# Patient Record
Sex: Female | Born: 1935 | Race: White | Hispanic: No | Marital: Married | Smoking: Never smoker
Health system: Southern US, Community
[De-identification: ages and names within clinical notes are randomized; demographics above are authoritative.]

## PROBLEM LIST (undated history)

## (undated) DIAGNOSIS — H3530 Unspecified macular degeneration: Secondary | ICD-10-CM

## (undated) DIAGNOSIS — Z298 Encounter for other specified prophylactic measures: Secondary | ICD-10-CM

## (undated) DIAGNOSIS — M51369 Other intervertebral disc degeneration, lumbar region without mention of lumbar back pain or lower extremity pain: Secondary | ICD-10-CM

## (undated) DIAGNOSIS — Z8781 Personal history of (healed) traumatic fracture: Secondary | ICD-10-CM

## (undated) DIAGNOSIS — D649 Anemia, unspecified: Secondary | ICD-10-CM

## (undated) DIAGNOSIS — Z8679 Personal history of other diseases of the circulatory system: Secondary | ICD-10-CM

## (undated) DIAGNOSIS — K5790 Diverticulosis of intestine, part unspecified, without perforation or abscess without bleeding: Secondary | ICD-10-CM

## (undated) DIAGNOSIS — F0390 Unspecified dementia without behavioral disturbance: Secondary | ICD-10-CM

## (undated) DIAGNOSIS — E785 Hyperlipidemia, unspecified: Secondary | ICD-10-CM

## (undated) DIAGNOSIS — R2681 Unsteadiness on feet: Secondary | ICD-10-CM

## (undated) DIAGNOSIS — R609 Edema, unspecified: Secondary | ICD-10-CM

## (undated) DIAGNOSIS — I2510 Atherosclerotic heart disease of native coronary artery without angina pectoris: Secondary | ICD-10-CM

## (undated) DIAGNOSIS — I350 Nonrheumatic aortic (valve) stenosis: Secondary | ICD-10-CM

## (undated) DIAGNOSIS — M1990 Unspecified osteoarthritis, unspecified site: Secondary | ICD-10-CM

## (undated) DIAGNOSIS — Z2989 Encounter for other specified prophylactic measures: Secondary | ICD-10-CM

## (undated) DIAGNOSIS — I10 Essential (primary) hypertension: Secondary | ICD-10-CM

## (undated) DIAGNOSIS — M5136 Other intervertebral disc degeneration, lumbar region: Secondary | ICD-10-CM

## (undated) DIAGNOSIS — S329XXA Fracture of unspecified parts of lumbosacral spine and pelvis, initial encounter for closed fracture: Secondary | ICD-10-CM

## (undated) DIAGNOSIS — S22000A Wedge compression fracture of unspecified thoracic vertebra, initial encounter for closed fracture: Secondary | ICD-10-CM

## (undated) DIAGNOSIS — J449 Chronic obstructive pulmonary disease, unspecified: Secondary | ICD-10-CM

## (undated) DIAGNOSIS — N393 Stress incontinence (female) (male): Secondary | ICD-10-CM

## (undated) DIAGNOSIS — N3281 Overactive bladder: Secondary | ICD-10-CM

## (undated) DIAGNOSIS — N189 Chronic kidney disease, unspecified: Secondary | ICD-10-CM

## (undated) DIAGNOSIS — M810 Age-related osteoporosis without current pathological fracture: Secondary | ICD-10-CM

## (undated) DIAGNOSIS — J984 Other disorders of lung: Secondary | ICD-10-CM

## (undated) HISTORY — DX: Edema, unspecified: R60.9

## (undated) HISTORY — PX: TONSILLECTOMY: SUR1361

## (undated) HISTORY — PX: OTHER SURGICAL HISTORY: SHX169

## (undated) HISTORY — DX: Personal history of other diseases of the circulatory system: Z86.79

## (undated) HISTORY — DX: Encounter for other specified prophylactic measures: Z29.8

## (undated) HISTORY — DX: Diverticulosis of intestine, part unspecified, without perforation or abscess without bleeding: K57.90

## (undated) HISTORY — DX: Stress incontinence (female) (male): N39.3

## (undated) HISTORY — DX: Hyperlipidemia, unspecified: E78.5

## (undated) HISTORY — PX: EYE SURGERY: SHX253

## (undated) HISTORY — DX: Unspecified osteoarthritis, unspecified site: M19.90

## (undated) HISTORY — DX: Chronic kidney disease, unspecified: N18.9

## (undated) HISTORY — PX: BACK SURGERY: SHX140

## (undated) HISTORY — DX: Other intervertebral disc degeneration, lumbar region: M51.36

## (undated) HISTORY — DX: Wedge compression fracture of unspecified thoracic vertebra, initial encounter for closed fracture: S22.000A

## (undated) HISTORY — DX: Age-related osteoporosis without current pathological fracture: M81.0

## (undated) HISTORY — DX: Encounter for other specified prophylactic measures: Z29.89

## (undated) HISTORY — DX: Unsteadiness on feet: R26.81

## (undated) HISTORY — DX: Unspecified dementia, unspecified severity, without behavioral disturbance, psychotic disturbance, mood disturbance, and anxiety: F03.90

## (undated) HISTORY — DX: Nonrheumatic aortic (valve) stenosis: I35.0

## (undated) HISTORY — DX: Other intervertebral disc degeneration, lumbar region without mention of lumbar back pain or lower extremity pain: M51.369

## (undated) HISTORY — DX: Fracture of unspecified parts of lumbosacral spine and pelvis, initial encounter for closed fracture: S32.9XXA

## (undated) HISTORY — DX: Chronic obstructive pulmonary disease, unspecified: J44.9

## (undated) HISTORY — DX: Other disorders of lung: J98.4

## (undated) HISTORY — DX: Anemia, unspecified: D64.9

## (undated) HISTORY — DX: Unspecified macular degeneration: H35.30

## (undated) HISTORY — DX: Atherosclerotic heart disease of native coronary artery without angina pectoris: I25.10

## (undated) HISTORY — DX: Overactive bladder: N32.81

## (undated) HISTORY — DX: Personal history of (healed) traumatic fracture: Z87.81

---

## 2003-05-01 ENCOUNTER — Encounter: Admission: RE | Admit: 2003-05-01 | Discharge: 2003-05-01 | Payer: Self-pay | Admitting: Orthopedic Surgery

## 2003-05-01 ENCOUNTER — Encounter: Payer: Self-pay | Admitting: Orthopedic Surgery

## 2003-11-21 ENCOUNTER — Encounter: Admission: RE | Admit: 2003-11-21 | Discharge: 2003-11-21 | Payer: Self-pay | Admitting: Neurosurgery

## 2003-12-05 ENCOUNTER — Encounter: Admission: RE | Admit: 2003-12-05 | Discharge: 2003-12-05 | Payer: Self-pay | Admitting: Neurosurgery

## 2003-12-19 ENCOUNTER — Encounter: Admission: RE | Admit: 2003-12-19 | Discharge: 2003-12-19 | Payer: Self-pay | Admitting: Neurosurgery

## 2004-04-23 ENCOUNTER — Ambulatory Visit (HOSPITAL_COMMUNITY): Admission: RE | Admit: 2004-04-23 | Discharge: 2004-04-23 | Payer: Self-pay | Admitting: Neurosurgery

## 2004-05-27 ENCOUNTER — Ambulatory Visit (HOSPITAL_COMMUNITY): Admission: RE | Admit: 2004-05-27 | Discharge: 2004-05-27 | Payer: Self-pay | Admitting: Gastroenterology

## 2004-09-08 ENCOUNTER — Inpatient Hospital Stay (HOSPITAL_COMMUNITY): Admission: RE | Admit: 2004-09-08 | Discharge: 2004-09-12 | Payer: Self-pay | Admitting: Neurosurgery

## 2011-08-27 ENCOUNTER — Emergency Department (HOSPITAL_COMMUNITY): Payer: Medicare Other

## 2011-08-27 ENCOUNTER — Emergency Department (HOSPITAL_COMMUNITY)
Admission: EM | Admit: 2011-08-27 | Discharge: 2011-08-27 | Disposition: A | Discharge status: Home / Self Care | Payer: Medicare Other | Attending: Emergency Medicine | Admitting: Emergency Medicine

## 2011-08-27 ENCOUNTER — Other Ambulatory Visit: Payer: Self-pay

## 2011-08-27 ENCOUNTER — Encounter (HOSPITAL_COMMUNITY): Payer: Self-pay | Admitting: *Deleted

## 2011-08-27 DIAGNOSIS — E785 Hyperlipidemia, unspecified: Secondary | ICD-10-CM | POA: Insufficient documentation

## 2011-08-27 DIAGNOSIS — R0602 Shortness of breath: Secondary | ICD-10-CM

## 2011-08-27 DIAGNOSIS — I099 Rheumatic heart disease, unspecified: Secondary | ICD-10-CM | POA: Insufficient documentation

## 2011-08-27 DIAGNOSIS — R079 Chest pain, unspecified: Secondary | ICD-10-CM | POA: Insufficient documentation

## 2011-08-27 DIAGNOSIS — M7989 Other specified soft tissue disorders: Secondary | ICD-10-CM | POA: Insufficient documentation

## 2011-08-27 DIAGNOSIS — I10 Essential (primary) hypertension: Secondary | ICD-10-CM | POA: Insufficient documentation

## 2011-08-27 DIAGNOSIS — R0989 Other specified symptoms and signs involving the circulatory and respiratory systems: Secondary | ICD-10-CM | POA: Insufficient documentation

## 2011-08-27 HISTORY — DX: Essential (primary) hypertension: I10

## 2011-08-27 LAB — BASIC METABOLIC PANEL
BUN: 21 mg/dL (ref 6–23)
CO2: 28 mEq/L (ref 19–32)
Calcium: 9.8 mg/dL (ref 8.4–10.5)
Chloride: 97 mEq/L (ref 96–112)
Creatinine, Ser: 1.16 mg/dL — ABNORMAL HIGH (ref 0.50–1.10)
GFR calc Af Amer: 47 mL/min — ABNORMAL LOW (ref >90)
GFR calc non Af Amer: 40 mL/min — ABNORMAL LOW (ref >90)
Glucose, Bld: 95 mg/dL (ref 70–99)
Potassium: 4.6 mEq/L (ref 3.5–5.1)
Sodium: 134 mEq/L — ABNORMAL LOW (ref 135–145)

## 2011-08-27 LAB — DIFFERENTIAL
Basophils Absolute: 0.1 10*3/uL (ref 0.0–0.1)
Basophils Relative: 1 % (ref 0–1)
Eosinophils Absolute: 0.1 10*3/uL (ref 0.0–0.7)
Eosinophils Relative: 1 % (ref 0–5)
Lymphocytes Relative: 13 % (ref 12–46)
Lymphs Abs: 1.3 10*3/uL (ref 0.7–4.0)
Monocytes Absolute: 1.4 10*3/uL — ABNORMAL HIGH (ref 0.1–1.0)
Monocytes Relative: 14 % — ABNORMAL HIGH (ref 3–12)
Neutro Abs: 6.9 10*3/uL (ref 1.7–7.7)
Neutrophils Relative %: 71 % (ref 43–77)

## 2011-08-27 LAB — TROPONIN I: Troponin I: <0.3 ng/mL (ref <0.30)

## 2011-08-27 LAB — CBC
MCHC: 34.3 g/dL (ref 30.0–36.0)
Platelets: 344 10*3/uL (ref 150–400)
RDW: 13.3 % (ref 11.5–15.5)
WBC: 9.8 10*3/uL (ref 4.0–10.5)

## 2011-08-27 NOTE — ED Provider Notes (Signed)
Medical screening examination/treatment/procedure(s) were conducted as a shared visit with non-physician practitioner(s) and myself.  I personally evaluated the patient during the encounter  Patient seen by me history of on and off chest pain lasting for more than 15 minutes for several weeks usually just about every day has been seen by her cardiologist seen by her primary care doctor and has a referral to a pulmonary doctor. Should etiology of the chest pain is not clear. Today EKG without acute findings troponin negative as well doubt that this is an acute cardiac event based on historical information. Today's pain started at least at 9:30 in the morning but she awoke with pain. Today's pain is different than any other day. Patient was a [facility] and the nurse there got concerned and sent patient in.  Shelda Jakes, MD 08/27/11 [phone]

## 2011-08-27 NOTE — ED Notes (Signed)
Patient placed on monitor and oxygen at 2L with saturation of 96%. NAD at this time. Husband at bedside.

## 2011-08-27 NOTE — ED Provider Notes (Signed)
History     CSN: [id number]  Arrival date & time 08/27/11  1203   First MD Initiated Contact with Patient 08/27/11 1211      Chief Complaint  Patient presents with  . Chest Pain  . Shortness of Breath    (Consider location/radiation/quality/duration/timing/severity/associated sxs/prior treatment) HPI History provided by pt and her husband, both of which are poor historians.   Pt initially reports that she has had SOB since yesterday.  Not constant nor intermittent and she is unable to describe further.  Not exertional; no aggravating/alleviating factors. Has had congestion in the center of her chest for the past 4-5 days.  Has had swelling in her ankles for the last week or so.   No fever, cough, CP.    Pt then reports that she has chronic SOB.  Her husband says that she has seen her physician multiple times in the recent past, etiology has not been determined and she has been referred to a pulmonologist.  Her pulmonology appointment is tomorrow.  There is nothing new about her symptoms today.  When probed further, husband says that SOB may be a little worse today.  H/o HTN, hyperlipidemia and rheumatic heart disease since age 26.  Sees Dr. Eldridge Dace.  Pt received aspirin en route to hospital.  Past Medical History  Diagnosis Date  . Hypertension     No past surgical history on file.  No family history on file.  History  Substance Use Topics  . Smoking status: Not on file  . Smokeless tobacco: Not on file  . Alcohol Use:     OB History    Grav Para Term Preterm Abortions TAB SAB Ect Mult Living                  Review of Systems  All other systems reviewed and are negative.    Allergies  Latex  Home Medications  No current outpatient prescriptions on file.  BP 156/61  Pulse 87  Temp(Src) 98.1 F (36.7 C) (Oral)  Resp 16  SpO2 98%  Physical Exam  Nursing note and vitals reviewed. Constitutional: She is oriented to person, place, and time. She appears  well-developed and well-nourished. No distress.  HENT:  Head: Normocephalic and atraumatic.  Eyes:       Normal appearance  Neck: Normal range of motion.  Cardiovascular: Normal rate and regular rhythm.   Pulmonary/Chest: Effort normal and breath sounds normal. No respiratory distress. She exhibits no tenderness.  Abdominal: Soft. Bowel sounds are normal. She exhibits no distension. There is no tenderness. There is no guarding.  Musculoskeletal:       2+ Dorsalis Pedis pulses. 1+ non-pitting edema bilateral ankles.  No calf ttp.   Neurological: She is alert and oriented to person, place, and time.  Skin: Skin is warm and dry. No rash noted. She is not diaphoretic.  Psychiatric: She has a normal mood and affect. Her behavior is normal.    ED Course  Procedures (including critical care time)   Date: 08/27/2011  Rate: 89  Rhythm: normal sinus rhythm and sinus arrhythmia  QRS Axis: left  Intervals: normal  ST/T Wave abnormalities: normal  Conduction Disutrbances:none  Narrative Interpretation: septal q qwaves  Old EKG Reviewed: changes noted (only change is new septal q waves since 2005)   Labs Reviewed  CBC - Abnormal; Notable for the following:    RBC 3.39 (*)    Hemoglobin 10.7 (*)    HCT 31.2 (*)  All other components within normal limits  DIFFERENTIAL - Abnormal; Notable for the following:    Monocytes Relative 14 (*)    Monocytes Absolute 1.4 (*)    All other components within normal limits  BASIC METABOLIC PANEL - Abnormal; Notable for the following:    Sodium 134 (*)    Creatinine, Ser 1.16 (*)    GFR calc non Af Amer 40 (*)    GFR calc Af Amer 47 (*)    All other components within normal limits  TROPONIN I   Dg Chest 2 View  08/27/2011  *RADIOLOGY REPORT*  Clinical Data: Shortness of breath  CHEST - 2 VIEW  Comparison: 04/23/2004  Findings: Borderline cardiomegaly noted.  Mild hyperinflation.  No acute infiltrate or pulmonary edema.  There is probable moderate  sized hiatal hernia measures 7.4 x 7.4 cm.  There is interval moderate compression fracture mid thoracic spine of indeterminate age.  Clinical correlation is necessary.  IMPRESSION:  No acute infiltrate or pulmonary edema.  There is probable moderate sized hiatal hernia measures 7.4 x 7.4 cm.  There is interval moderate compression fracture mid thoracic spine of indeterminate age.  Clinical correlation is necessary.  Original Report Authenticated By: Natasha Mead, M.D.     1. Shortness of breath       MDM  Pt presents w/ chronic SOB.  Has been evaluated by her PCP and has a f/u appt w/ pulmonary scheduled for tomorrow.  No new change in sx today.  Vital signs w/in nml limits, no respiratory distress and lungs CTA on exam.  EKG non-ischemic, CXR neg and troponin neg.  Results discussed w/ pt.  Discharged home.  Advised to keep appt w/ pulm and return to ER if sx change/worsen.  Dr. Deretha Emory has seen pt and is in agreement w/ A&P.        Arie Sabina Austin, [place] 08/27/11 2212

## 2011-08-27 NOTE — ED Notes (Signed)
Pt c/o cp and sob since 0930. Presents hypertensive. Pt given ntg sl and 324mg  asa pta. Pt appears nad.

## 2011-08-27 NOTE — ED Notes (Signed)
Pt stating she is cold; 2 warm blankets given to pt

## 2011-08-27 NOTE — ED Notes (Addendum)
Patient states she saw the nurse at [facility] this morning and EMS was called for Chest Pain radiating to her back and SOB. Hx of Hypertension. Patient denies N/V. Husband is at bedside.

## 2011-08-28 ENCOUNTER — Ambulatory Visit (INDEPENDENT_AMBULATORY_CARE_PROVIDER_SITE_OTHER): Payer: Medicare Other | Admitting: Internal Medicine

## 2011-08-28 ENCOUNTER — Encounter: Payer: Self-pay | Admitting: Internal Medicine

## 2011-08-28 ENCOUNTER — Other Ambulatory Visit (INDEPENDENT_AMBULATORY_CARE_PROVIDER_SITE_OTHER): Payer: Medicare Other

## 2011-08-28 VITALS — BP 132/98 | HR 92 | Temp 97.8°F | Ht 60.0 in | Wt 133.4 lb

## 2011-08-28 DIAGNOSIS — R0609 Other forms of dyspnea: Secondary | ICD-10-CM

## 2011-08-28 DIAGNOSIS — D649 Anemia, unspecified: Secondary | ICD-10-CM

## 2011-08-28 DIAGNOSIS — M810 Age-related osteoporosis without current pathological fracture: Secondary | ICD-10-CM

## 2011-08-28 DIAGNOSIS — R0600 Dyspnea, unspecified: Secondary | ICD-10-CM

## 2011-08-28 DIAGNOSIS — R0989 Other specified symptoms and signs involving the circulatory and respiratory systems: Secondary | ICD-10-CM

## 2011-08-28 LAB — BRAIN NATRIURETIC PEPTIDE: Pro B Natriuretic peptide (BNP): 108 pg/mL — ABNORMAL HIGH (ref 0.0–100.0)

## 2011-08-28 LAB — TSH: TSH: 1.3 u[IU]/mL (ref 0.35–5.50)

## 2011-08-28 MED ORDER — TRAMADOL HCL 50 MG PO TABS: ORAL_TABLET | ORAL | Status: AC

## 2011-08-28 MED ORDER — FUROSEMIDE 20 MG PO TABS: 20.0000 mg | ORAL_TABLET | Freq: Every day | ORAL | Status: DC

## 2011-08-28 NOTE — Progress Notes (Signed)
  Subjective:    Patient ID: Sheri Hartman, female    DOB: September 24, 1918   MRN: [id number]  HPI    Brief patient profile:  62 yowf never smoker no previous breathing problems with new onset sob first week in January 2013 referred 08/28/2011  By Dr Zachery Dauer to pulmonary clinic  08/28/2011 1st pulmonary eval cc abrupt onset sob and leg swelling x 2 weeks assoc with dry  cough and severe midline upper back pain correlated with compression fx on cxr.   No assoc nasal drainage or overt HB but does have large HH.   Presently more weak than sob and can't get out of chair and on to exam table even with me assisting her.  Sleeping ok without nocturnal  or early am exacerbation  of respiratory  c/o's or need for noct saba. Also denies any obvious fluctuation of symptoms with weather or environmental changes or other aggravating or alleviating factors except as outlined above     Review of Systems  Constitutional: Negative for fever and unexpected weight change.  HENT: Positive for congestion. Negative for ear pain, nosebleeds, sore throat, rhinorrhea, sneezing, trouble swallowing, dental problem, postnasal drip and sinus pressure.   Eyes: Negative for redness and itching.  Respiratory: Positive for cough, chest tightness and shortness of breath. Negative for wheezing.   Cardiovascular: Positive for palpitations and leg swelling.  Gastrointestinal: Negative for nausea and vomiting.  Genitourinary: Negative for dysuria.  Musculoskeletal: Negative for joint swelling.  Skin: Negative for rash.  Neurological: Negative for headaches.  Hematological: Does not bruise/bleed easily.  Psychiatric/Behavioral: Negative for dysphoric mood. The patient is nervous/anxious.        Objective:   Physical Exam  Very frail elderly wf who doesn't look stated age but acts it when trying to stand  Wt 133 08/23/11  HEENT: nl dentition, turbinates, and orophanx. Nl external ear canals without cough reflex   NECK :   without JVD/Nodes/TM/ nl carotid upstrokes bilaterally   LUNGS:  Chest is kyphotic, with no acc muscle use, clear to A and P bilaterally without cough on insp or exp maneuvers   CV:  RRR  no s3 or murmur or increase in P2 with 2+ pitting edema sym bilaterally   ABD:  soft and nontender with nl excursion in the supine position. No bruits or organomegaly, bowel sounds nl  MS:  warm without deformities, calf tenderness, cyanosis or clubbing - tenderness over mid to upper t spine   SKIN: warm and dry without lesions    NEURO:  alert, approp, no deficits   08/27/11 cxr No acute infiltrate or pulmonary edema. There is probable  moderate sized hiatal hernia measures 7.4 x 7.4 cm. There is  interval moderate compression fracture mid thoracic spine of  indeterminate age. Clinical     08/28/11 Labs Hct 31%,  bnp 108,  Nl bmet and tsh       Assessment & Plan:

## 2011-08-28 NOTE — Patient Instructions (Signed)
Start furosemide 20 mg one each am  For chest pain or  Cough take tramadol 50 mg one every 4 hours as needed  Return to Dr Zachery Dauer next week for referral for back pain.  GERD (REFLUX)  is an extremely common cause of respiratory symptoms(like cough and short of breath), many times with no significant heartburn at all.    It can be treated with medication, but also with lifestyle changes including avoidance of late meals, excessive alcohol, smoking cessation, and avoid fatty foods, chocolate, peppermint, colas, red wine, and acidic juices such as orange juice.  NO MINT OR MENTHOL PRODUCTS SO NO COUGH DROPS  USE SUGARLESS CANDY INSTEAD (jolley ranchers or Stover's)  NO OIL BASED VITAMINS - use powdered substitutes.  Please remember to go to the lab  department downstairs for your tests - we will call you with the results when they are available.  Pulmonary follow up is as needed

## 2011-08-29 DIAGNOSIS — D631 Anemia in chronic kidney disease: Secondary | ICD-10-CM | POA: Insufficient documentation

## 2011-08-29 NOTE — Assessment & Plan Note (Signed)
1/18/213 Walked RA x one lap @ 185 stopped due to  Weakness, not sob or desat  Symptoms are markedly disproportionate to objective findings and not clear this is a lung problem but pt does appear to have difficult airway management issues. DDX of  difficult airways managment all start with A and  include Adherence, Ace Inhibitors, Acid Reflux, Active Sinus Disease, Alpha 1 Antitripsin deficiency, Anxiety masquerading as Airways dz,  ABPA,  allergy(esp in young), Aspiration (esp in elderly), Adverse effects of DPI,  Active smokers, plus two Bs  = Bronchiectasis and Beta blocker use..and one C= CHF   ? Acid reflux > does have large HH> rec gerd diet  ? CHF less likely with bnp so low but does have sym edema and HBP > try low dose lasix  And f/u with Dr Zachery Dauer and Jim Like

## 2011-08-29 NOTE — Assessment & Plan Note (Signed)
Vit D level 53 08/28/11  She has definite compression fx t spine on lat cxr  that correlates with her hx and exam > try tramadol and refer back to Dr Zachery Dauer for f/u options which may be limited at age 78

## 2011-08-29 NOTE — Assessment & Plan Note (Signed)
Normocytic and mild,  ? Baseline > defer w/u options to Dr Zachery Dauer, doubt contributing to sob or weakness

## 2011-08-31 NOTE — Progress Notes (Signed)
Quick Note:  Spoke with pt and notified of results per Dr. Wert. Pt verbalized understanding and denied any questions.  ______ 

## 2011-09-10 ENCOUNTER — Other Ambulatory Visit: Payer: Self-pay | Admitting: Orthopedic Surgery

## 2011-09-10 DIAGNOSIS — M549 Dorsalgia, unspecified: Secondary | ICD-10-CM

## 2011-09-16 ENCOUNTER — Ambulatory Visit
Admission: RE | Admit: 2011-09-16 | Discharge: 2011-09-16 | Disposition: A | Discharge status: Home / Self Care | Payer: Medicare Other | Source: Ambulatory Visit | Attending: Orthopedic Surgery | Admitting: Orthopedic Surgery

## 2011-09-16 DIAGNOSIS — M549 Dorsalgia, unspecified: Secondary | ICD-10-CM

## 2011-09-21 ENCOUNTER — Telehealth (HOSPITAL_COMMUNITY): Payer: Self-pay

## 2011-09-24 ENCOUNTER — Institutional Professional Consult (permissible substitution): Payer: Self-pay | Admitting: Internal Medicine

## 2011-10-27 NOTE — Telephone Encounter (Signed)
Contacts         Type  Contact  Phone    09/21/2011 10:17 AM  Phone ([address]  Aretha, Levi (Self)  [phone] (H)    Completed- spoke with pt to schedule IR consult ahe advised she is going to her PCP for help

## 2012-10-17 ENCOUNTER — Telehealth: Payer: Self-pay | Admitting: *Deleted

## 2012-10-20 DIAGNOSIS — N393 Stress incontinence (female) (male): Secondary | ICD-10-CM | POA: Insufficient documentation

## 2012-10-20 DIAGNOSIS — M5030 Other cervical disc degeneration, unspecified cervical region: Secondary | ICD-10-CM | POA: Insufficient documentation

## 2012-10-20 DIAGNOSIS — N3281 Overactive bladder: Secondary | ICD-10-CM | POA: Insufficient documentation

## 2012-10-20 DIAGNOSIS — Z298 Encounter for other specified prophylactic measures: Secondary | ICD-10-CM | POA: Insufficient documentation

## 2012-10-20 DIAGNOSIS — Z8679 Personal history of other diseases of the circulatory system: Secondary | ICD-10-CM | POA: Insufficient documentation

## 2012-10-20 DIAGNOSIS — I350 Nonrheumatic aortic (valve) stenosis: Secondary | ICD-10-CM | POA: Insufficient documentation

## 2012-10-20 DIAGNOSIS — E785 Hyperlipidemia, unspecified: Secondary | ICD-10-CM | POA: Insufficient documentation

## 2012-10-20 DIAGNOSIS — I10 Essential (primary) hypertension: Secondary | ICD-10-CM | POA: Insufficient documentation

## 2012-10-20 DIAGNOSIS — M1990 Unspecified osteoarthritis, unspecified site: Secondary | ICD-10-CM | POA: Insufficient documentation

## 2012-10-20 DIAGNOSIS — K5790 Diverticulosis of intestine, part unspecified, without perforation or abscess without bleeding: Secondary | ICD-10-CM | POA: Insufficient documentation

## 2012-10-21 ENCOUNTER — Encounter: Payer: Self-pay | Admitting: Thoracic Surgery (Cardiothoracic Vascular Surgery)

## 2012-10-21 ENCOUNTER — Institutional Professional Consult (permissible substitution) (INDEPENDENT_AMBULATORY_CARE_PROVIDER_SITE_OTHER): Payer: Medicare Other | Admitting: Thoracic Surgery (Cardiothoracic Vascular Surgery)

## 2012-10-21 VITALS — BP 129/73 | HR 88 | Resp 18 | Ht <= 58 in | Wt 128.0 lb

## 2012-10-21 DIAGNOSIS — I350 Nonrheumatic aortic (valve) stenosis: Secondary | ICD-10-CM

## 2012-10-21 DIAGNOSIS — I359 Nonrheumatic aortic valve disorder, unspecified: Secondary | ICD-10-CM

## 2012-10-21 NOTE — Patient Instructions (Signed)
Report any progression of shortness of breath or the development of chest pain and/or dizzy spells to your cardiologist

## 2012-10-21 NOTE — Progress Notes (Signed)
[address]            [name] [address]          [phone]     CARDIOTHORACIC SURGERY CONSULTATION REPORT  Referring Provider is Corky Crafts *MD PCP is Gaye Alken, MD  Chief Complaint  Patient presents with  . Aortic Stenosis    surgical eval for TAVR, ECHO 10/11/2012    HPI:  Patient is a 79 year old married white female with long history of heart murmur and reported history of rheumatic fever during her childhood.  She has been followed recently by Dr. Eldridge Dace for aortic stenosis, and repeat transthoracic echocardiogram has suggested progression of disease. Most recent echocardiogram performed 10/11/2012 is felt to demonstrate moderate to severe aortic stenosis. Peak velocity across the valve was measured 2.8 m/s corresponding to a mean transvalvular gradient of 18 mm mercury. Aortic valve area was estimated 0.8 cm. Left ventricular systolic function remains normal with ejection fraction 65%.  The patient has been referred to the multidisciplinary valve clinic to investigate whether or not transcatheter aortic valve replacement might be an option for her treatment.  The patient has remained remarkably healthy all of her life.  She lives with her husband at friend's home in the independent apartment. She ambulates using a walker without to much difficulty. She and her husband both note that she has had some progression of exertional shortness of breath over the past couple of years. She denies any resting shortness of breath, PND, orthopnea, palpitations, dizzy spells, or syncope. She's never had any chest pain or chest tightness. She and her husband both note that she seems to be slowing down overall due to a combination of mild exertional shortness of breath and decreased energy.   She states that she gets tired easily than she used to but for the most part she is getting around fairly well and doing whatever she wants without too  much limitation. She and her husband both note that she has become a bit more forgetful, but this has not been problematic and overall she has no complaints with her current functional status.  Past Medical History  Diagnosis Date  . Hypertension   . Severe aortic stenosis   . History of rheumatic fever   . Degenerative arthritis   . Hyperlipidemia   . Stress incontinence   . Diverticulosis   . Chronic kidney disease     STABLE  . History of fractured pelvis   . DDD (degenerative disc disease), lumbar   . OAB (overactive bladder)   . SBE (subacute bacterial endocarditis) prophylaxis candidate     Past Surgical History  Procedure Laterality Date  . Back surgery      lumbar disc surgery  . Tonsillectomy    . Eye surgery    . Arthroscopic knee surgery on the left      No family history on file.  History   Social History  . Marital Status: Married    Spouse Name: N/A    Number of Children: 3  . Years of Education: N/A   Occupational History  . Retired    Social History Main Topics  . Smoking status: Never Smoker   . Smokeless tobacco: Never Used  . Alcohol Use: Yes     Comment: 4 oz wine daily in the evening  . Drug Use: Not on file  . Sexually Active:  Not on file   Other Topics Concern  . Not on file   Social History Narrative  . No narrative on file    Current Outpatient Prescriptions  Medication Sig Dispense Refill  . Ascorbic Acid (VITAMIN C PO) Take by mouth daily.      . Cholecalciferol (VITAMIN D PO) Take by mouth daily.      . furosemide (LASIX) 20 MG tablet Take 1 tablet (20 mg total) by mouth daily.  30 tablet  11  . losartan-hydrochlorothiazide (HYZAAR) 100-12.5 MG per tablet Take 1 tablet by mouth daily.      . metoprolol succinate (TOPROL-XL) 25 MG 24 hr tablet Take 25 mg by mouth daily.      . Multiple Vitamin (MULTIVITAMIN) tablet Take 1 tablet by mouth daily.      . Multiple Vitamins-Minerals (ICAPS) CAPS Take 1 capsule by mouth daily.        . nitroGLYCERIN (NITRODUR - DOSED IN MG/24 HR) 0.2 mg/hr Place 1 patch onto the skin daily.      . pravastatin (PRAVACHOL) 20 MG tablet Take 20 mg by mouth daily.      Marland Kitchen VITAMIN E COMPLEX PO Take by mouth daily.       No current facility-administered medications for this visit.    Allergies  Allergen Reactions  . Latex Other (See Comments)    Unknown....years ago....eczema  . Ciprofloxacin Other (See Comments)    dizziness and disorientation  . Tramadol Other (See Comments)    Disorientation, disconnected       Review of Systems:   General:  decreased appetite, decreased energy, no weight gain, no weight loss, no fever  Cardiac:  no chest pain with exertion, no chest pain at rest, + SOB with moderate exertion, no resting SOB, no PND, no orthopnea, no palpitations, no arrhythmia, no atrial fibrillation, mild LE edema, no dizzy spells, no syncope  Respiratory:  + exertional shortness of breath, no home oxygen, no productive cough, no dry cough, no bronchitis, no wheezing, no hemoptysis, no asthma, no pain with inspiration or cough, no sleep apnea, no CPAP at night  GI:   no difficulty swallowing, no reflux, no frequent heartburn, + hiatal hernia, no abdominal pain, no constipation, no diarrhea, no hematochezia, no hematemesis, no melena  GU:   no dysuria,  no frequency, no urinary tract infection, no hematuria, + stress urinary incontinence, kidney stones, + mild chronic kidney disease  Vascular:  no pain suggestive of claudication, + pain in feet, no leg cramps, no varicose veins, no DVT, no non-healing foot ulcer  Neuro:   no stroke, no TIA's, no seizures, no headaches, no temporary blindness one eye,  no slurred speech, no peripheral neuropathy, no chronic pain, mild instability of gait, + mild memory/cognitive dysfunction  Musculoskeletal: no arthritis, no joint swelling, no myalgias, mild difficulty walking due to balance - uses a walker, mildly limited mobility   Skin:   no rash,  no itching, no skin infections, no pressure sores or ulcerations  Psych:   no anxiety, no depression, no nervousness, no unusual recent stress  Eyes:   no blurry vision, no floaters, no recent vision changes, + wears glasses or contacts  ENT:   no hearing loss, no loose or painful teeth, + dentures, last saw dentist 2010  Hematologic:  no easy bruising, no abnormal bleeding, no clotting disorder, no frequent epistaxis  Endocrine:  no diabetes, does not check CBG's at home     Physical Exam:  BP 129/73  Pulse 88  Resp 18  Ht 4\' 10"  (1.473 m)  Wt 128 lb (58.06 kg)  BMI 26.76 kg/m2  SpO2 96%  General:  Elderly and frail-appearing  HEENT:  Unremarkable   Neck:   no JVD, no bruits, no adenopathy   Chest:   clear to auscultation, symmetrical breath sounds, no wheezes, no rhonchi  CV:   RRR, soft crescendo/decrescendo systolic murmur best RSB  Abdomen:  soft, non-tender, no masses   Extremities:  warm, well-perfused, pulses diminished, no LE edema  Rectal/GU  Deferred  Neuro:   Grossly non-focal and symmetrical throughout  Skin:   Clean and dry, no rashes, no breakdown   Diagnostic Tests:  Transthoracic Echocardiogram  I have directly reviewed both report and the images from most recent transthoracic echocardiogram performed 10/11/2012 at [facility]. The patient has aortic stenosis with severe calcification of the aortic valve with severely restricted leaflet mobility. Imaging windows of the exam are not ideal but the valve appeared to be tricuspid. Peak velocity measured across the valve was reported to 0.8 m/s corresponding to a mean transvalvular gradient of 18 mm mercury. There were several different envelopes used to interrogate the valve, but in no instance was the peak velocity greater than 4 m/s. Left ventricular systolic function appears normal with ejection fraction calculated 65%. There is mild aortic insufficiency, mild mitral regurgitation, and mild tricuspid  regurgitation.   STS Risk Calculator  Procedure    AVR  Risk of Mortality   10.3% Morbidity or Mortality  31.5% Prolonged LOS   17.0% Short LOS    11.0% Permanent Stroke   4.4% Prolonged Vent Support  19.9% DSW Infection    0.10% Renal Failure    8.4% Reoperation    12.7%    Impression:  The patient has at least moderate aortic stenosis with preserved left ventricular systolic function.  I personally reviewed the patient's most recent echocardiogram and there is no question that she has significant aortic stenosis. However, by strict criteria the degree of stenosis is not yet severe based on this most recent echocardiogram. Despite this, the patient describes a slow progression of symptoms of exertional shortness of breath and fatigue. This has developed over the past couple of years, and during this time the patient has developed decreased appetite without significant weight loss, mild short-term memory dysfunction, mildly decreased mobility with some instability of gait which she accommodates for quite well using a walker.  Overall I expect that is likely that is significant component to the patient's gradual decline over the past 2 years is related to her aortic stenosis, although at age 69 there may be more to it than that.  The patient's most recent echocardiogram does not clearly demonstrate the presence of severe aortic stenosis.  If the patient were to be shown to have severe aortic stenosis (peak velocity greater than 4 m/s and/or mean transvalvular gradient greater than 40 mmHg at rest or during stress) then transcatheter aortic valve replacement could be considered as an alternative to high-risk conventional surgery.  Repeat transthoracic echocardiogram and/or catheterization could be considered to further delineate whether or not this is the case.    Plan:  I discussed matters at length with the patient and her husband in the office today. The patient seems disinclined to  pursue an aggressive approach at this time as she notes that she seems to be getting along reasonably well despite her above mentioned complaints.  Under the circumstances it seems reasonable to  just continue to follow her with serial echocardiograms for the time being. We will plan to see her back in the multidisciplinary valve clinic in 6 months.    Salvatore Decent. Cornelius Moras, MD 10/21/2012 11:29 AM

## 2013-03-15 ENCOUNTER — Other Ambulatory Visit: Payer: Self-pay | Admitting: *Deleted

## 2013-03-15 DIAGNOSIS — I359 Nonrheumatic aortic valve disorder, unspecified: Secondary | ICD-10-CM

## 2013-03-23 ENCOUNTER — Encounter: Payer: Medicare Other | Admitting: Nurse Practitioner

## 2013-04-27 ENCOUNTER — Other Ambulatory Visit: Payer: Self-pay | Admitting: *Deleted

## 2013-04-28 ENCOUNTER — Encounter (HOSPITAL_COMMUNITY): Payer: Medicare Other | Admitting: Thoracic Surgery (Cardiothoracic Vascular Surgery)

## 2013-04-28 ENCOUNTER — Ambulatory Visit (HOSPITAL_COMMUNITY)
Admission: RE | Admit: 2013-04-28 | Discharge: 2013-04-28 | Disposition: A | Discharge status: Home / Self Care | Payer: Medicare Other | Source: Ambulatory Visit | Attending: Family Medicine | Admitting: Family Medicine

## 2013-04-28 ENCOUNTER — Encounter (HOSPITAL_COMMUNITY): Payer: Medicare Other | Admitting: Cardiovascular Disease

## 2013-04-28 DIAGNOSIS — I10 Essential (primary) hypertension: Secondary | ICD-10-CM | POA: Insufficient documentation

## 2013-04-28 DIAGNOSIS — I079 Rheumatic tricuspid valve disease, unspecified: Secondary | ICD-10-CM | POA: Insufficient documentation

## 2013-04-28 DIAGNOSIS — R0989 Other specified symptoms and signs involving the circulatory and respiratory systems: Secondary | ICD-10-CM | POA: Insufficient documentation

## 2013-04-28 DIAGNOSIS — R0609 Other forms of dyspnea: Secondary | ICD-10-CM | POA: Insufficient documentation

## 2013-04-28 DIAGNOSIS — I379 Nonrheumatic pulmonary valve disorder, unspecified: Secondary | ICD-10-CM | POA: Insufficient documentation

## 2013-04-28 DIAGNOSIS — E785 Hyperlipidemia, unspecified: Secondary | ICD-10-CM

## 2013-04-28 DIAGNOSIS — I359 Nonrheumatic aortic valve disorder, unspecified: Secondary | ICD-10-CM | POA: Insufficient documentation

## 2013-04-28 DIAGNOSIS — I059 Rheumatic mitral valve disease, unspecified: Secondary | ICD-10-CM | POA: Insufficient documentation

## 2013-04-28 DIAGNOSIS — I517 Cardiomegaly: Secondary | ICD-10-CM | POA: Insufficient documentation

## 2013-04-28 DIAGNOSIS — R0600 Dyspnea, unspecified: Secondary | ICD-10-CM

## 2013-04-28 DIAGNOSIS — I350 Nonrheumatic aortic (valve) stenosis: Secondary | ICD-10-CM

## 2013-04-28 NOTE — Progress Notes (Signed)
  Echocardiogram 2D Echocardiogram has been performed.  Sheri Hartman FRANCES 04/28/2013, 2:13 PM

## 2013-05-01 ENCOUNTER — Ambulatory Visit (INDEPENDENT_AMBULATORY_CARE_PROVIDER_SITE_OTHER): Payer: Medicare Other | Admitting: Thoracic Surgery (Cardiothoracic Vascular Surgery)

## 2013-05-01 ENCOUNTER — Encounter: Payer: Self-pay | Admitting: Thoracic Surgery (Cardiothoracic Vascular Surgery)

## 2013-05-01 VITALS — BP 144/72 | HR 81 | Resp 16 | Ht <= 58 in | Wt 116.0 lb

## 2013-05-01 DIAGNOSIS — I359 Nonrheumatic aortic valve disorder, unspecified: Secondary | ICD-10-CM

## 2013-05-01 DIAGNOSIS — I350 Nonrheumatic aortic (valve) stenosis: Secondary | ICD-10-CM

## 2013-05-01 NOTE — Progress Notes (Signed)
[address]       [name] [address]             [phone]     CARDIOTHORACIC SURGERY OFFICE NOTE  Referring Provider is Everette Rank, MD PCP is GREEN, Lenon Curt, MD   HPI:  Patient returns for followup of aortic stenosis.  The patient is a 79 year old married white female with multiple medical problems including aortic stenosis with reported history of rheumatic fever in the distant past, hypertension, degenerative arthritis, mild dementia, chronic kidney disease. She was originally seen in consultation on 10/21/2012. At that time transthoracic echocardiogram revealed moderate to severe aortic stenosis with mean transvalvular gradient estimated 18 mm mercury with normal left ventricular systolic function. The patient described stable symptoms of mild exertional shortness of breath and decreased energy. The possibility of low gradient severe aortic stenosis was entertained, but the patient and her husband felt most comfortable with continued observation rather than proceeding with further diagnostic testing at that time.  According to the patient's husband she has remained quite stable over the past 6 months.  The patient's functional status remains quite limited but unchanged. She lives a very sedentary lifestyle. She is reported have stable symptoms of exertional shortness of breath, although the patient doesn't recall having problems with shortness of breath. She has not had any dizzy spells nor syncope. She's never had any sort of chest discomfort either with activity or at rest. She has stable mild bilateral lower extremity edema. She has chronic problems with both short and long-term memory, but this apparently has not gotten any worse over the past 6 months. She underwent repeat followup echocardiogram last week and returns to the office today to review the results. She has not yet been seen in followup by Dr. Eldridge Dace, although she apparently has an appointment to  see him sometime in November.   Current Outpatient Prescriptions  Medication Sig Dispense Refill  . Ascorbic Acid (VITAMIN C PO) Take by mouth daily.      . Cholecalciferol (VITAMIN D PO) Take by mouth daily.      . furosemide (LASIX) 20 MG tablet Take 1 tablet (20 mg total) by mouth daily.  30 tablet  11  . losartan-hydrochlorothiazide (HYZAAR) 100-12.5 MG per tablet Take 1 tablet by mouth daily.      . metoprolol succinate (TOPROL-XL) 25 MG 24 hr tablet Take 25 mg by mouth daily.      . Multiple Vitamin (MULTIVITAMIN) tablet Take 1 tablet by mouth daily.      . Multiple Vitamins-Minerals (ICAPS) CAPS Take 1 capsule by mouth daily.      . nitroGLYCERIN (NITRODUR - DOSED IN MG/24 HR) 0.2 mg/hr Place 1 patch onto the skin daily.      . pravastatin (PRAVACHOL) 20 MG tablet Take 20 mg by mouth daily.      Marland Kitchen VITAMIN E COMPLEX PO Take by mouth daily.       No current facility-administered medications for this visit.      Physical Exam:   BP 144/72  Pulse 81  Resp 16  Ht 4\' 10"  (1.473 m)  Wt 116 lb (52.617 kg)  BMI 24.25 kg/m2  SpO2 95%  General:  Somewhat frail but pleasant, in no acute distress  Chest:   Clear to auscultation  CV:   Regular rate and rhythm with systolic murmur  Incisions:  n/a  Abdomen:  Soft and nontender  Extremities:  Warm and well-perfused with  mild bilateral lower extremity edema  Diagnostic Tests:  Transthoracic Echocardiography  Patient: Sheri Hartman, Sheri Hartman MR #: [id number] Study Date: 04/28/2013 Gender: F Age: 62 Height: 147.3cm Weight: 54.5kg BSA: 1.79m^2 Pt. Status: Room:  PERFORMING [facility], Ec Rutherford Limerick MD SONOGRAPHER Silvano Bilis, RCS cc:  ------------------------------------------------------------ LV EF: 50% - 55%  ------------------------------------------------------------ Indications: 424.1 Aortic valve disorders.  ------------------------------------------------------------ History: PMH: Acquired from  the patient. Dyspnea. Risk factors: Hypertension. Dyslipidemia.  ------------------------------------------------------------ Study Conclusions  - Left ventricle: The cavity size was normal. Wall thickness was increased in a pattern of mild LVH. Systolic function was normal. The estimated ejection fraction was in the range of 50% to 55%. Wall motion was normal; there were no regional wall motion abnormalities. Doppler parameters are consistent with abnormal left ventricular relaxation (grade 1 diastolic dysfunction). Doppler parameters are consistent with high ventricular filling pressure. - Aortic valve: Transvalvular velocity was increased. There was moderate to severe stenosis. Mild regurgitation. Valve area: 0.77cm^2(VTI). Valve area: 0.7cm^2 (Vmax). - Mitral valve: Moderately to severely calcified annulus. - Pulmonary arteries: Systolic pressure was mildly increased. PA peak pressure: 38mm Hg (S). Transthoracic echocardiography. M-mode, complete 2D, spectral Doppler, and color Doppler. Height: Height: 147.3cm. Height: 58in. Weight: Weight: 54.5kg. Weight: 120lb. Body mass index: BMI: 25.1kg/m^2. Body surface area: BSA: 1.72m^2. Blood pressure: Self reported blood pressure by patient is134/66. Unable to obtain blood pressure in lab due to equipment malfunction. Patient status: Outpatient. Location: Echo laboratory.  ------------------------------------------------------------  ------------------------------------------------------------ Left ventricle: The cavity size was normal. Wall thickness was increased in a pattern of mild LVH. Systolic function was normal. The estimated ejection fraction was in the range of 50% to 55%. Wall motion was normal; there were no regional wall motion abnormalities. Doppler parameters are consistent with abnormal left ventricular relaxation (grade 1 diastolic dysfunction). Doppler parameters are consistent with high ventricular filling  pressure.  ------------------------------------------------------------ Aortic valve: Moderately thickened, severely calcified leaflets. Doppler: Transvalvular velocity was increased. There was moderate to severe stenosis. Mild regurgitation. Valve area: 0.77cm^2(VTI). Indexed valve area: 0.51cm^2/m^2 (VTI). Peak velocity ratio of LVOT to aortic valve: 0.28. Valve area: 0.7cm^2 (Vmax). Indexed valve area: 0.46cm^2/m^2 (Vmax). Mean gradient: 25mm Hg (S). Peak gradient: 42mm Hg (S).  ------------------------------------------------------------ Aorta: The aorta was normal, not dilated, and non-diseased.  ------------------------------------------------------------ Mitral valve: Moderately to severely calcified annulus. Doppler: Peak gradient: 4mm Hg (D).  ------------------------------------------------------------ Left atrium: The atrium was normal in size.  ------------------------------------------------------------ Atrial septum: Poorly visualized.  ------------------------------------------------------------ Right ventricle: The cavity size was normal. Wall thickness was normal. Systolic function was normal.  ------------------------------------------------------------ Pulmonic valve: Structurally normal valve. Cusp separation was normal. Doppler: Transvalvular velocity was within the normal range. Trivial regurgitation.  ------------------------------------------------------------ Tricuspid valve: Structurally normal valve. Leaflet separation was normal. Doppler: Transvalvular velocity was within the normal range. Mild regurgitation.  ------------------------------------------------------------ Pulmonary artery: Poorly visualized. Systolic pressure was mildly increased.  ------------------------------------------------------------ Right atrium: The atrium was normal in size.  ------------------------------------------------------------ Pericardium: There was no  pericardial effusion.  ------------------------------------------------------------ Systemic veins: Inferior vena cava: The vessel was normal in size; the respirophasic diameter changes were in the normal range (= 50%); findings are consistent with normal central venous pressure.  ------------------------------------------------------------ Post procedure conclusions Ascending Aorta:  - The aorta was normal, not dilated, and non-diseased.  ------------------------------------------------------------  2D measurements Normal Doppler measurements Normal Left ventricle Main pulmonary LVID ED, 34 mm 43-52 artery chord, Pressure 38 mm Hg =30 PLAX , S LVID ES, 19.6 mm 23-38 Left ventricle chord, Ea, lat 6.2 cm/s ------ PLAX  ann, FS, chord, 42 % >29 tiss DP PLAX E/Ea, 15.97 ------ LVPW, ED 10.9 mm ------ lat ann, IVS/LVPW 0.98 <1.3 tiss DP ratio, ED Ea, med 5.33 cm/s ------ Ventricular septum ann, IVS, ED 10.7 mm ------ tiss DP LVOT E/Ea, 18.57 ------ Diam, S 18 mm ------ med ann, Area 2.54 cm^2 ------ tiss DP Aorta LVOT Root diam, 29 mm ------ Peak 89.5 cm/s ------ ED vel, S Left atrium Aortic valve AP dim 34 mm ------ Peak 325 cm/s ------ AP dim 2.25 cm/m^2 <2.2 vel, S index Mean 239 cm/s ------ vel, S VTI, S 60 cm ------ Mean 25 mm Hg ------ gradient , S Peak 42 mm Hg ------ gradient , S Area, 0.77 cm^2 ------ VTI Area 0.51 cm^2/m^ ------ index 2 (VTI) Peak vel 0.28 ------ ratio, LVOT/AV Area, 0.7 cm^2 ------ Vmax Area 0.46 cm^2/m^ ------ index 2 (Vmax) Mitral valve Peak E 99 cm/s ------ vel Peak A 145 cm/s ------ vel Peak 4 mm Hg ------ gradient , D Peak E/A 0.7 ------ ratio Tricuspid valve Regurg 286 cm/s ------ peak vel Peak 33 mm Hg ------ RV-RA gradient , S Max 286 cm/s ------ regurg vel Systemic veins Estimate 5 mm Hg ------ d CVP Right ventricle Pressure 38 mm Hg <30 , S Sa vel, 12.2 cm/s ------ lat ann, tiss  DP  ------------------------------------------------------------ Prepared and Electronically Authenticated by  Lyn Records [id number]    Impression:  I've reviewed both the report and the images from the patient's recent followup echocardiogram.  The patient has at least moderate aortic stenosis with normal left ventricular systolic function.  Peak velocity across the aortic valve remains less than 4 m/s with mean transvalvular gradient estimated approximately 25 mm mercury.  The severity of aortic stenosis probably borders on being severe, and I suspect that dobutamine stress echocardiogram would likely confirmed the presence of severe aortic stenosis. However, the patient remains quite stable clinically and neither she nor her husband seems inclined to pursue an aggressive approach at this time. Given her age and somewhat limited functional status this may be the best approach.    Plan:  I've discussed matters at length with the patient and her husband in the office today. If she were to develop worsening symptoms of congestive heart failure that it might be reasonable to proceed with further diagnostic testing to discern whether or not the patient might be candidate for transcatheter aortic valve replacement. However, given that the patient remains quite stable at this point it seems reasonable to continue to follow her conservatively. We will see her back in 6 months with repeat echocardiogram.   Salvatore Decent. Cornelius Moras, MD 05/01/2013 11:17 AM

## 2013-07-03 ENCOUNTER — Ambulatory Visit: Payer: Medicare Other | Admitting: Interventional Cardiology

## 2013-07-11 ENCOUNTER — Encounter: Payer: Self-pay | Admitting: Interventional Cardiology

## 2013-07-11 ENCOUNTER — Ambulatory Visit (INDEPENDENT_AMBULATORY_CARE_PROVIDER_SITE_OTHER): Payer: Medicare Other | Admitting: Interventional Cardiology

## 2013-07-11 VITALS — BP 120/60 | HR 88 | Ht 60.0 in | Wt 117.1 lb

## 2013-07-11 DIAGNOSIS — I359 Nonrheumatic aortic valve disorder, unspecified: Secondary | ICD-10-CM

## 2013-07-11 DIAGNOSIS — R0600 Dyspnea, unspecified: Secondary | ICD-10-CM

## 2013-07-11 DIAGNOSIS — R0609 Other forms of dyspnea: Secondary | ICD-10-CM

## 2013-07-11 DIAGNOSIS — I350 Nonrheumatic aortic (valve) stenosis: Secondary | ICD-10-CM

## 2013-07-11 DIAGNOSIS — R0602 Shortness of breath: Secondary | ICD-10-CM

## 2013-07-11 DIAGNOSIS — I10 Essential (primary) hypertension: Secondary | ICD-10-CM

## 2013-07-11 LAB — BASIC METABOLIC PANEL
Calcium: 9.8 mg/dL (ref 8.4–10.5)
Creatinine, Ser: 2.2 mg/dL — ABNORMAL HIGH (ref 0.4–1.2)
GFR: 21.92 mL/min — ABNORMAL LOW (ref >60.00)
Potassium: 4.9 mEq/L (ref 3.5–5.1)

## 2013-07-11 LAB — BRAIN NATRIURETIC PEPTIDE: Pro B Natriuretic peptide (BNP): 69 pg/mL (ref 0.0–100.0)

## 2013-07-11 MED ORDER — LOSARTAN POTASSIUM 100 MG PO TABS: 100.0000 mg | ORAL_TABLET | Freq: Every day | ORAL | Status: DC

## 2013-07-11 NOTE — Progress Notes (Signed)
Patient ID: Sheri Hartman, female   DOB: August 25, 1918, 79 y.o.   MRN: [id number]    [address] [place], [place]  [address] Phone: [phone] Fax:  [phone]  Date:  07/11/2013   ID:  Sheri Hartman, DOB 03-17-1919, MRN [id number]  PCP:  Sheri Relic, MD      History of Present Illness: Sheri Hartman is a 79 y.o. female who has had moderate to severe AS. SHe has persistent shortness of breath with exertion. No SHOB at rest. Normal oxygen sat with walking in the past. Patient feels this is related to old age. BNP in March was normal. She has been taking Lasix daily.  She discussed TAVR with Dr. Cornelius Hartman. They agreed to not pursue this workup. Although she is bothered by her symptoms, she does not want to do anything invasive. She feels the same way today. She does have some ankle swelling. It is worse at the end of the day.    Wt Readings from Last 3 Encounters:  07/11/13 117 lb 1.9 oz (53.125 kg)  05/01/13 116 lb (52.617 kg)  10/21/12 128 lb (58.06 kg)     Past Medical History  Diagnosis Date  . Hypertension   . History of rheumatic fever   . Degenerative arthritis   . Hyperlipidemia   . Stress incontinence   . Diverticulosis   . Chronic kidney disease     STABLE  . History of fractured pelvis   . DDD (degenerative disc disease), lumbar   . OAB (overactive bladder)   . SBE (subacute bacterial endocarditis) prophylaxis candidate   . Osteoporosis, senile   . Macular degeneration   . Aortic stenosis     Current Outpatient Prescriptions  Medication Sig Dispense Refill  . Ascorbic Acid (VITAMIN C PO) Take by mouth daily.      . Cholecalciferol (VITAMIN D PO) Take by mouth daily.      Marland Kitchen losartan-hydrochlorothiazide (HYZAAR) 100-12.5 MG per tablet Take 1 tablet by mouth daily.      . Multiple Vitamin (MULTIVITAMIN) tablet Take 1 tablet by mouth daily.      . Multiple Vitamins-Minerals (ICAPS) CAPS Take 1 capsule by mouth daily.      . nitroGLYCERIN  (NITRODUR - DOSED IN MG/24 HR) 0.2 mg/hr Place 1 patch onto the skin daily.      . NON FORMULARY Calcium plus zinc      . pravastatin (PRAVACHOL) 20 MG tablet Take 20 mg by mouth daily.      Marland Kitchen VITAMIN E COMPLEX PO Take by mouth daily.      . furosemide (LASIX) 20 MG tablet Take 1 tablet (20 mg total) by mouth daily.  30 tablet  11   No current facility-administered medications for this visit.    Allergies:    Allergies  Allergen Reactions  . Latex Other (See Comments)    Unknown....years ago....eczema  . Ciprofloxacin Other (See Comments)    dizziness and disorientation  . Tramadol Other (See Comments)    Disorientation, disconnected     Social History:  The patient  reports that she has never smoked. She has never used smokeless tobacco. She reports that she drinks alcohol. She reports that she does not use illicit drugs.   Family History:  The patient's Family history is unknown by patient.   ROS:  Please see the history of present illness.  No nausea, vomiting.  No fevers, chills.  No focal weakness.  No dysuria.  Shortness of breath.  All other systems reviewed and negative.   PHYSICAL EXAM: VS:  BP 120/60  Pulse 88  Ht 5' (1.524 m)  Wt 117 lb 1.9 oz (53.125 kg)  BMI 22.87 kg/m2 Well nourished, well developed, in no acute distress HEENT: normal Neck: no JVD, no carotid bruits Cardiac:  normal S1, S2; RRR; 3/6 systolic murmur Lungs:  clear to auscultation bilaterally, no wheezing, rhonchi or rales Abd: soft, nontender, no hepatomegaly Ext: Bilateral ankle edema Skin: warm and dry Neuro:   no focal abnormalities noted      ASSESSMENT AND PLAN:  Aortic valve disorders  Moderate AS by prior echo. No angina or syncope. Persistent dyspnea on exertion. Not a candidate for TAVR.   2. Essential hypertension, benign: controlled today. Check BP at home.  change losartan HCTZ to plain losartan. She is already getting furosemide as a diuretic.   3. Shortness of breath  LAB:  Basic Metabolic   GLUCOSE 88 70-99 - mg/dL   BUN 28 1-61 - mg/dL H  CREATININE 0.96 0.45-4.09 - mg/dl   eGFR (NON-AFRICAN AMERICAN) 40 >60 - calc L  eGFR (AFRICAN AMERICAN) 49 >60 - calc L  SODIUM 133 136-145 - mmol/L L  POTASSIUM 4.7 3.5-5.5 - mmol/L   CHLORIDE 95 98-107 - mmol/L L  C02 33 22-32 - mmol/L H  ANION GAP 10.5 6.0-20.0 - mmol/L   CALCIUM 9.9 8.6-10.3 - mg/dL    Sheri Hartman 81/19/1478 05:43:48 PM > renal functions better. Increase Lasix to 20 mg daily. Repeat Bmet in 2 weeks Sheri Hartman 07/05/2012 08:41:15 AM > pt notified. Meds updated and lab ordered.   LAB: BNP   B-NATRIURETIC PEPTIDE 133 0-100 - pg/mL H   Sheri Hartman 07/04/2012 05:42:24 PM > would increase Lasix dose to 20 mg daily. This may help with shortness of breath. Sheri Hartman 07/05/2012 08:41:56 AM > Pt notified.   Some of this may be age related. Weight daily. If weight increases by more than three pounds, can take extra half pill of the lasix.  Recheck be met and BNP today.  although this may be multifactorial, we will try that manage her fluid status as best as we can.   Procedures     Signed, Sheri Mare, MD, [facility] 07/11/2013 12:35 PM

## 2013-07-11 NOTE — Patient Instructions (Signed)
Your physician recommends that you return for lab work today for BNP and Bmet.  Your physician has recommended you make the following change in your medication:   1. Stop Losartan-HCTZ.  2. Start Losartan 100 mg 1 tablet by mouth daily.  Your physician wants you to follow-up in: 1 year with Dr. Eldridge Dace. You will receive a reminder letter in the mail two months in advance. If you don't receive a letter, please call our office to schedule the follow-up appointment.

## 2013-07-12 ENCOUNTER — Encounter: Payer: Self-pay | Admitting: Interventional Cardiology

## 2013-07-12 ENCOUNTER — Other Ambulatory Visit: Payer: Self-pay | Admitting: Interventional Cardiology

## 2013-07-12 DIAGNOSIS — Z79899 Other long term (current) drug therapy: Secondary | ICD-10-CM

## 2013-07-19 ENCOUNTER — Telehealth: Payer: Self-pay | Admitting: Interventional Cardiology

## 2013-07-19 ENCOUNTER — Other Ambulatory Visit (INDEPENDENT_AMBULATORY_CARE_PROVIDER_SITE_OTHER): Payer: Medicare Other

## 2013-07-19 DIAGNOSIS — Z79899 Other long term (current) drug therapy: Secondary | ICD-10-CM

## 2013-07-19 LAB — BASIC METABOLIC PANEL
Calcium: 9.6 mg/dL (ref 8.4–10.5)
Chloride: 100 mEq/L (ref 96–112)
GFR: 25.16 mL/min — ABNORMAL LOW (ref >60.00)
Sodium: 141 mEq/L (ref 135–145)

## 2013-07-19 NOTE — Telephone Encounter (Signed)
New Message  Pt has a lab (BMET) appt today and she request a call back to confirm that the appt is needed// please call

## 2013-07-19 NOTE — Telephone Encounter (Signed)
Spoke with patient's husband who called to verify that patient's lab appointment is necessary.  I advised husband that repeat BMET is needed to recheck kidney function and electrolytes as patient's medications were changed on 12/2 by Dr. Eldridge Dace.  Husband conferred with wife and verbalized agreement; states they will be here sometime this afternoon for lab.

## 2013-07-19 NOTE — Telephone Encounter (Signed)
Per Dr Allred/ DOD, pt to hold Losartan till Dr Eldridge Dace able to review. Unable to reach pt at either numbers/ msg left on her daughters cell phone.

## 2013-07-20 NOTE — Telephone Encounter (Signed)
Spoke with pts husband. See Bmet result note, which I will send to Dr. Eldridge Dace for further instructions.

## 2013-07-21 NOTE — Telephone Encounter (Signed)
Spoke to pts husband. See Bmet result note.

## 2013-07-21 NOTE — Telephone Encounter (Signed)
Follow up    Daughter is returning someone's call from Wednesday.  She lives out of town and thinks someone could not get in touch with her parents regarding her potassium.

## 2013-07-21 NOTE — Telephone Encounter (Signed)
Spoke with pts husband and he will let his daughter know that we got in touch with pt.

## 2013-07-21 NOTE — Telephone Encounter (Signed)
Follow up ° ° ° ° °Returning Sheri Hartman's call °

## 2013-07-27 ENCOUNTER — Other Ambulatory Visit (INDEPENDENT_AMBULATORY_CARE_PROVIDER_SITE_OTHER): Payer: Medicare Other

## 2013-07-27 DIAGNOSIS — Z79899 Other long term (current) drug therapy: Secondary | ICD-10-CM

## 2013-07-27 LAB — BASIC METABOLIC PANEL
CO2: 32 mEq/L (ref 19–32)
Chloride: 104 mEq/L (ref 96–112)
Creatinine, Ser: 1.4 mg/dL — ABNORMAL HIGH (ref 0.4–1.2)
Potassium: 4.9 mEq/L (ref 3.5–5.1)
Sodium: 141 mEq/L (ref 135–145)

## 2013-07-28 ENCOUNTER — Telehealth: Payer: Self-pay | Admitting: Cardiology

## 2013-07-28 MED ORDER — FUROSEMIDE 20 MG PO TABS: 20.0000 mg | ORAL_TABLET | Freq: Every day | ORAL | Status: DC

## 2013-07-28 NOTE — Telephone Encounter (Signed)
Updated meds per bmet labs.

## 2013-07-29 ENCOUNTER — Encounter: Payer: Self-pay | Admitting: Interventional Cardiology

## 2013-09-05 ENCOUNTER — Other Ambulatory Visit: Payer: Self-pay

## 2013-09-05 MED ORDER — NITROGLYCERIN 0.2 MG/HR TD PT24: 0.2000 mg | MEDICATED_PATCH | Freq: Every day | TRANSDERMAL | Status: DC

## 2013-09-20 ENCOUNTER — Other Ambulatory Visit: Payer: Self-pay | Admitting: *Deleted

## 2013-09-20 MED ORDER — PRAVASTATIN SODIUM 20 MG PO TABS: 20.0000 mg | ORAL_TABLET | Freq: Every day | ORAL | Status: DC

## 2013-10-09 ENCOUNTER — Other Ambulatory Visit: Payer: Self-pay | Admitting: *Deleted

## 2013-10-09 DIAGNOSIS — I350 Nonrheumatic aortic (valve) stenosis: Secondary | ICD-10-CM

## 2013-10-26 ENCOUNTER — Ambulatory Visit (HOSPITAL_COMMUNITY)
Admission: RE | Admit: 2013-10-26 | Discharge: 2013-10-26 | Disposition: A | Discharge status: Home / Self Care | Payer: Medicare Other | Source: Ambulatory Visit | Attending: Thoracic Surgery (Cardiothoracic Vascular Surgery) | Admitting: Thoracic Surgery (Cardiothoracic Vascular Surgery)

## 2013-10-26 DIAGNOSIS — I350 Nonrheumatic aortic (valve) stenosis: Secondary | ICD-10-CM

## 2013-10-26 DIAGNOSIS — I359 Nonrheumatic aortic valve disorder, unspecified: Secondary | ICD-10-CM

## 2013-10-27 NOTE — Addendum Note (Signed)
Encounter addended by: Cresenciano Genreindy S Axelle Szwed, CCT on: 10/27/2013 11:19 AM<BR>     Documentation filed: Charges VN

## 2013-10-30 ENCOUNTER — Ambulatory Visit (INDEPENDENT_AMBULATORY_CARE_PROVIDER_SITE_OTHER): Payer: Medicare Other | Admitting: Thoracic Surgery (Cardiothoracic Vascular Surgery)

## 2013-10-30 VITALS — BP 144/67 | HR 93 | Resp 16 | Ht <= 58 in | Wt 128.0 lb

## 2013-10-30 DIAGNOSIS — I359 Nonrheumatic aortic valve disorder, unspecified: Secondary | ICD-10-CM

## 2013-10-30 DIAGNOSIS — I350 Nonrheumatic aortic (valve) stenosis: Secondary | ICD-10-CM

## 2013-10-30 NOTE — Progress Notes (Signed)
[address]       [place] [address]             [phone]     CARDIOTHORACIC SURGERY OFFICE NOTE  Referring Provider is Everette Rank, MD PCP is Gaye Alken, MD   HPI:  Patient returns for followup of aortic stenosis. She was originally seen in consultation on 10/21/2012, and more recently she was seen in followup on 05/01/2013. She has at least moderate aortic stenosis with preserved left ventricular systolic function. She describes chronic symptoms of mild exertional shortness breath and decreased energy. At the time of her last office visit she made it clear that she really wasn't interested in any type of aggressive therapy, and given her significantly advanced age and somewhat limited functional status this seemed very appropriate. However, her husband brings her back for repeat echocardiogram in followup office visit today. Both the patient and her husband state that she really hasn't changed significantly over the past 6 months. She gets tired easily and stops to take rest breaks frequently when she is ambulatory. She lives a very sedentary lifestyle.  She denies experiencing any significant symptoms of dyspnea either with or without activity.  She has mild cognitive dysfunction with short-term memory difficulty, although this reportedly also remained stable. She has is reported to have a marginal appetite although her weight has been stable. She does not forget to eat or miss meals.  She has never had any chest pain or chest tightness with or without activity. She has not had any dizzy spells nor syncope. The remainder of her review of systems is noncontributory. Over the past 6 months she has not developed any other new significant medical problems or complaints.   Current Outpatient Prescriptions  Medication Sig Dispense Refill  . Ascorbic Acid (VITAMIN C PO) Take by mouth daily.      . Cholecalciferol (VITAMIN D PO) Take by mouth daily.      .  furosemide (LASIX) 20 MG tablet Take 1 tablet (20 mg total) by mouth daily.  30 tablet  11  . Multiple Vitamin (MULTIVITAMIN) tablet Take 1 tablet by mouth daily.      . Multiple Vitamins-Minerals (ICAPS) CAPS Take 1 capsule by mouth daily.      . nitroGLYCERIN (NITRODUR - DOSED IN MG/24 HR) 0.2 mg/hr patch Place 1 patch (0.2 mg total) onto the skin daily.  30 patch  6  . NON FORMULARY Calcium plus zinc      . Omega-3 Fatty Acids (OMEGA 3 PO) Take by mouth.      . pravastatin (PRAVACHOL) 20 MG tablet Take 1 tablet (20 mg total) by mouth daily.  30 tablet  6  . VITAMIN E COMPLEX PO Take by mouth daily.       No current facility-administered medications for this visit.      Physical Exam:   BP 144/67  Pulse 93  Resp 16  Ht 4\' 10"  (1.473 m)  Wt 128 lb (58.06 kg)  BMI 26.76 kg/m2  General:  Elderly and frail  Chest:   clear  CV:   Regular rate and rhythm with crescendo decrescendo systolic murmur  Incisions:  n/a  Abdomen:  Soft and nontender  Extremities:  Warm and well-perfused with mild to moderate bilateral LE edema  Diagnostic Tests:  Transthoracic Echocardiography  Patient: Sheri Hartman, Sheri Hartman MR #: [id number] Study Date: 10/26/2013 Gender: F Age: 80 Height: Weight: 53.2kg BSA: Pt. Status: Room:  Nila Nephew,  Marilu Favrelarence MD SONOGRAPHER Trellis MomentMelissa Morford, RDCS PERFORMING [facility], Outpatient cc:  ------------------------------------------------------------ LV EF: 60% - 65%  ------------------------------------------------------------ Indications: Hypertension - benign 401.1.  ------------------------------------------------------------ History: PMH: HX rheumatic fever Dyspnea. Aortic valve disease. Risk factors: Dyslipidemia.  ------------------------------------------------------------ Study Conclusions  - Left ventricle: The cavity size was normal. There was mild concentric hypertrophy. Systolic function was normal. The estimated ejection fraction was in the  range of 60% to 65%. Wall motion was normal; there were no regional wall motion abnormalities. Doppler parameters are consistent with abnormal left ventricular relaxation (grade 1 diastolic dysfunction). Doppler parameters are consistent with high ventricular filling pressure. - Aortic valve: Cusp separation was reduced. There was moderate stenosis. Mild regurgitation. Peak velocity: 362cm/s (S). Mean gradient: 30mm Hg (S). Valve area: 0.97cm^2(VTI). Valve area: 0.85cm^2 (Vmax). - Mitral valve: Calcified annulus. Impressions:  - When compared to ECHO from 04/28/13 - aortic stenosis has progressed (prior peak velocity 3.4430m/s). Transthoracic echocardiography. M-mode, complete 2D, spectral Doppler, and color Doppler. Weight: Weight: 53.2kg. Weight: 117lb. Patient status: Outpatient. Location: Echo laboratory.  ------------------------------------------------------------  ------------------------------------------------------------ Left ventricle: The cavity size was normal. There was mild concentric hypertrophy. Systolic function was normal. The estimated ejection fraction was in the range of 60% to 65%. Wall motion was normal; there were no regional wall motion abnormalities. Doppler parameters are consistent with abnormal left ventricular relaxation (grade 1 diastolic dysfunction). Doppler parameters are consistent with high ventricular filling pressure.  ------------------------------------------------------------ Aortic valve: Mildly thickened, mildly calcified leaflets. Cusp separation was reduced. Doppler: There was moderate stenosis. Mild regurgitation. Valve area: 0.97cm^2(VTI). Peak velocity ratio of LVOT to aortic valve: 0.27. Valve area: 0.85cm^2 (Vmax). Mean gradient: 30mm Hg (S). Peak gradient: 52mm Hg (S).  ------------------------------------------------------------ Aorta: The aorta was normal, not dilated, and  non-diseased.  ------------------------------------------------------------ Mitral valve: Calcified annulus. Leaflet separation was normal. Doppler: Transvalvular velocity was within the normal range. There was no evidence for stenosis. No regurgitation. Peak gradient: 5mm Hg (D).  ------------------------------------------------------------ Left atrium: The atrium was normal in size.  ------------------------------------------------------------ Right ventricle: The cavity size was normal. Wall thickness was normal. Systolic function was normal.  ------------------------------------------------------------ Pulmonic valve: Structurally normal valve. Cusp separation was normal. Doppler: Transvalvular velocity was within the normal range. No regurgitation.  ------------------------------------------------------------ Tricuspid valve: Structurally normal valve. Leaflet separation was normal. Doppler: No regurgitation.  ------------------------------------------------------------ Pulmonary artery: The main pulmonary artery was normal-sized.  ------------------------------------------------------------ Right atrium: The atrium was normal in size.  ------------------------------------------------------------ Pericardium: The pericardium was normal in appearance. There was no pericardial effusion.  ------------------------------------------------------------ Systemic veins: Inferior vena cava: The vessel was normal in size; the respirophasic diameter changes were in the normal range (= 50%); findings are consistent with normal central venous pressure.  ------------------------------------------------------------ Post procedure conclusions Ascending Aorta:  - The aorta was normal, not dilated, and non-diseased.  ------------------------------------------------------------  2D measurements Normal Doppler Normal Left ventricle measurements LVID ED, 26.8 mm 43-52 Left  ventricle chord, PLAX Ea, lat 5.66 cm/ ------- LVID ES, 22.3 mm 23-38 ann, tiss s chord, PLAX DP FS, chord, 17 % >29 E/Ea, lat 19.79 ------- PLAX ann, tiss LVPW, ED 13.1 mm ------ DP IVS/LVPW 1.08 <1.3 Ea, med 3.81 cm/ ------- ratio, ED ann, tiss s Ventricular septum DP IVS, ED 14.1 mm ------ E/Ea, med 29.4 ------- LVOT ann, tiss Diam, S 20 mm ------ DP Area 3.14 cm^2 ------ LVOT Aorta Peak vel, S 97.6 cm/ ------- Root diam, 30 mm ------ s ED Aortic valve Left atrium Peak vel, S 362 cm/ ------- AP dim 31 mm ------  s Mean vel, S 240 cm/ ------- s VTI, S 64.7 cm ------- Mean 30 mm ------- gradient, S Hg Peak 52 mm ------- gradient, S Hg Area, VTI 0.97 cm^ ------- 2 Peak vel 0.27 ------- ratio, LVOT/AV Area, Vmax 0.85 cm^ ------- 2 Mitral valve Peak E vel 112 cm/ ------- s Peak A vel 144 cm/ ------- s Deceleratio 257 ms 150-230 n time Peak 5 mm ------- gradient, D Hg Peak E/A 0.8 ------- ratio Systemic veins Estimated 3 mm ------- CVP Hg  ------------------------------------------------------------ Prepared and Electronically Authenticated by  Donato Schultz 2015-03-19T17:12:[id number]    Impression:  The patient has at least moderate aortic stenosis with normal left ventricular systolic function. Peak velocity across the aortic valve remains less than 4 m/s with mean transvalvular gradient estimated approximately 30 mm mercury.  This may have gotten a slight amount worse over the past 6 months, and I suspect that the dobutamine stress echocardiogram would likely confirm the presence of severe aortic stenosis. However, the patient continues to remain stable clinically. Most importantly, the patient remains disinclined to consider any type of an aggressive approach to her aortic stenosis.   Plan:  The patient and her husband were again counseled treatment alternatives for management of severe symptomatic aortic stenosis. Alternative approaches such as conventional  aortic valve replacement, transcatheter aortic valve replacement, and palliative medical therapy were compared and contrasted at length.   They understand that she would not be considered candidate for conventional surgical aortic valve replacement under any circumstances, and under the circumstances I am somewhat skeptical that transcatheter aortic valve replacement would be a good idea.  Long-term prognosis with medical therapy was discussed and put in the context of a patient over the age of 50.  All of their questions been addressed.  The patient will continue to followup intermittently with Dr. Eldridge Dace. We would be delighted to see her back again in the future as needed should further questions arise.    Salvatore Decent. Cornelius Moras, MD 10/30/2013 4:04 PM

## 2013-12-18 ENCOUNTER — Other Ambulatory Visit: Payer: Self-pay | Admitting: *Deleted

## 2013-12-18 MED ORDER — FUROSEMIDE 20 MG PO TABS: 20.0000 mg | ORAL_TABLET | Freq: Every day | ORAL | Status: DC

## 2014-01-23 ENCOUNTER — Other Ambulatory Visit: Payer: Self-pay | Admitting: *Deleted

## 2014-01-23 MED ORDER — FUROSEMIDE 20 MG PO TABS: 20.0000 mg | ORAL_TABLET | Freq: Every day | ORAL | Status: DC

## 2014-01-23 MED ORDER — PRAVASTATIN SODIUM 20 MG PO TABS: 20.0000 mg | ORAL_TABLET | Freq: Every day | ORAL | Status: DC

## 2014-01-23 MED ORDER — NITROGLYCERIN 0.2 MG/HR TD PT24: 0.2000 mg | MEDICATED_PATCH | Freq: Every day | TRANSDERMAL | Status: DC

## 2014-02-23 ENCOUNTER — Telehealth: Payer: Self-pay | Admitting: Hematology and Oncology

## 2014-02-23 NOTE — Telephone Encounter (Signed)
S/W PT SPOUSE IN REF TO NP APPT ON  03/08/14@10 :00 REF DR BARNES DX-ABNORMAL  CBC

## 2014-03-08 ENCOUNTER — Encounter: Payer: Self-pay | Admitting: Hematology and Oncology

## 2014-03-08 ENCOUNTER — Ambulatory Visit: Payer: Medicare Other

## 2014-03-08 ENCOUNTER — Ambulatory Visit (HOSPITAL_BASED_OUTPATIENT_CLINIC_OR_DEPARTMENT_OTHER): Payer: Medicare Other | Admitting: Hematology and Oncology

## 2014-03-08 VITALS — BP 169/72 | HR 90 | Temp 97.9°F | Resp 20 | Ht <= 58 in | Wt 114.1 lb

## 2014-03-08 DIAGNOSIS — D72821 Monocytosis (symptomatic): Secondary | ICD-10-CM

## 2014-03-08 DIAGNOSIS — N039 Chronic nephritic syndrome with unspecified morphologic changes: Secondary | ICD-10-CM

## 2014-03-08 DIAGNOSIS — M810 Age-related osteoporosis without current pathological fracture: Secondary | ICD-10-CM

## 2014-03-08 DIAGNOSIS — I359 Nonrheumatic aortic valve disorder, unspecified: Secondary | ICD-10-CM

## 2014-03-08 DIAGNOSIS — N189 Chronic kidney disease, unspecified: Secondary | ICD-10-CM | POA: Insufficient documentation

## 2014-03-08 DIAGNOSIS — N184 Chronic kidney disease, stage 4 (severe): Secondary | ICD-10-CM

## 2014-03-08 DIAGNOSIS — D631 Anemia in chronic kidney disease: Secondary | ICD-10-CM

## 2014-03-08 DIAGNOSIS — I350 Nonrheumatic aortic (valve) stenosis: Secondary | ICD-10-CM

## 2014-03-08 NOTE — Assessment & Plan Note (Signed)
I suspect the cause of her anemia is from chronic kidney disease. The patient is not symptomatic. For shortness of breath is likely related to her severe aortic stenosis and not from the anemia. As for the patient, unless the anemia dropped to less than 10 g, there is no indication to treat with erythropoietin stimulating agents.

## 2014-03-08 NOTE — Assessment & Plan Note (Signed)
Her aortic stenosis is quite severe. I suspect her shortness of breath is related to severe aortic stenosis. I would defer to her cardiologist for management.

## 2014-03-08 NOTE — Progress Notes (Signed)
[facility] CONSULT NOTE  Patient Care Team: Gaye Alken, MD as PCP - General (Family Medicine) Miguel Aschoff, MD as Attending Physician (Obstetrics and Gynecology) Corky Crafts, MD as Attending Physician (Cardiology) Purcell Nails, MD as Attending Physician (Cardiothoracic Surgery) Hulda Humphrey, NP as Nurse Practitioner (Cardiology) Nyoka Cowden, MD as Attending Physician (Pulmonary Disease) [place] Mast X, NP as Nurse Practitioner (Nurse Practitioner)  CHIEF COMPLAINTS/PURPOSE OF CONSULTATION:  Anemia and mild monocytosis HISTORY OF PRESENTING ILLNESS:  Sheri Hartman 80 y.o. female is here because of abnormal CBC.  She was found to have abnormal CBC from routine blood work. Review of her CBC from 2012 showed occasional leukocytosis. Differential showed very mild monocytosis with absolute monocyte count ranging from 1.1-1.5. She is also noted to be mildly anemic with hemoglobin ranging from 9.9-11.5. The patient is noted to have chronic kidney disease.  She denies recent chest pain on exertion does have significant shortness of breath on minimal exertion. She denies pre-syncopal episodes, or palpitations. She had not noticed any recent bleeding such as epistaxis, hematuria or hematochezia The patient denies over the counter NSAID ingestion. She is not on antiplatelets agents.  She had no prior history or diagnosis of cancer. Her age appropriate screening programs are up-to-date. She denies any pica and eats a variety of diet. She donated blood more than 20 years ago but never received blood transfusion She complained of chronic leg edema.   MEDICAL HISTORY:  Past Medical History  Diagnosis Date  . Hypertension   . History of rheumatic fever   . Degenerative arthritis   . Hyperlipidemia   . Stress incontinence   . Diverticulosis   . Chronic kidney disease     STABLE  . History of fractured pelvis   . DDD  (degenerative disc disease), lumbar   . OAB (overactive bladder)   . SBE (subacute bacterial endocarditis) prophylaxis candidate   . Osteoporosis, senile   . Macular degeneration   . Aortic stenosis   . Anemia     SURGICAL HISTORY: Past Surgical History  Procedure Laterality Date  . Back surgery      lumbar disc surgery  . Tonsillectomy    . Eye surgery    . Arthroscopic knee surgery on the left      SOCIAL HISTORY: History   Social History  . Marital Status: Married    Spouse Name: N/A    Number of Children: 3  . Years of Education: N/A   Occupational History  . Retired Location manager    Social History Main Topics  . Smoking status: Never Smoker   . Smokeless tobacco: Never Used  . Alcohol Use: Yes     Comment: 4 oz wine daily in the evening  . Drug Use: No  . Sexual Activity: Not on file   Other Topics Concern  . Not on file   Social History Narrative  . No narrative on file    FAMILY HISTORY: History reviewed. No pertinent family history.  ALLERGIES:  is allergic to latex; ciprofloxacin; and tramadol.  MEDICATIONS:  Current Outpatient Prescriptions  Medication Sig Dispense Refill  . Ascorbic Acid (VITAMIN C PO) Take by mouth daily.      . Cholecalciferol (VITAMIN D PO) Take by mouth daily.      . furosemide (LASIX) 20 MG tablet Take 10 mg by mouth daily.      . Multiple Vitamin (MULTIVITAMIN) tablet Take 1 tablet by mouth daily.      Marland Kitchen  Multiple Vitamins-Minerals (ICAPS) CAPS Take 1 capsule by mouth daily.      . nitroGLYCERIN (NITRODUR - DOSED IN MG/24 HR) 0.2 mg/hr patch Place 1 patch (0.2 mg total) onto the skin daily.  90 patch  1  . NON FORMULARY Calcium plus zinc and magnesium      . Omega-3 Fatty Acids (OMEGA 3 PO) Take by mouth.      . pravastatin (PRAVACHOL) 20 MG tablet Take 1 tablet (20 mg total) by mouth daily.  90 tablet  1  . sodium chloride (OCEAN) 0.65 % SOLN nasal spray Place 1 spray into both nostrils as needed for congestion.      Marland Kitchen.  VITAMIN E COMPLEX PO Take by mouth daily.       No current facility-administered medications for this visit.    REVIEW OF SYSTEMS:   Constitutional: Denies fevers, chills or abnormal night sweats Eyes: Denies blurriness of vision, double vision or watery eyes Ears, nose, mouth, throat, and face: Denies mucositis or sore throat Cardiovascular: Denies palpitation, chest discomfort  Gastrointestinal:  Denies nausea, heartburn or change in bowel habits Skin: Denies abnormal skin rashes Lymphatics: Denies new lymphadenopathy or easy bruising Neurological:Denies numbness, tingling or new weaknesses Behavioral/Psych: Mood is stable, no new changes  All other systems were reviewed with the patient and are negative.  PHYSICAL EXAMINATION: ECOG PERFORMANCE STATUS: 1 - Symptomatic but completely ambulatory  Filed Vitals:   03/08/14 1015  BP: 169/72  Pulse: 90  Temp: 97.9 F (36.6 C)  Resp: 20   Filed Weights   03/08/14 1015  Weight: 114 lb 1.6 oz (51.755 kg)    GENERAL:alert, no distress and comfortable SKIN: skin color, texture, turgor are normal, no rashes or significant lesions EYES: normal, conjunctiva are pink and non-injected, sclera clear OROPHARYNX:no exudate, no erythema and lips, buccal mucosa, and tongue normal  NECK: supple, thyroid normal size, non-tender, without nodularity LYMPH:  no palpable lymphadenopathy in the cervical, axillary or inguinal LUNGS: clear to auscultation and percussion with normal breathing effort HEART: regular rate & rhythm with loud systolic murmur and moderate bilateral lower extremity edema ABDOMEN:abdomen soft, non-tender and normal bowel sounds Musculoskeletal:no cyanosis of digits and no clubbing  PSYCH: alert & oriented x 3 with fluent speech NEURO: no focal motor/sensory deficits  LABORATORY DATA:  I have reviewed the data as listed  ASSESSMENT & PLAN:  Anemia in chronic kidney disease I suspect the cause of her anemia is from  chronic kidney disease. The patient is not symptomatic. For shortness of breath is likely related to her severe aortic stenosis and not from the anemia. As for the patient, unless the anemia dropped to less than 10 g, there is no indication to treat with erythropoietin stimulating agents.  Monocytosis She is noted to have very mild monocytosis. Overall, she is not symptomatic. CMML cannot be excluded however given the stability of her monocyte count and total white count, she does not need further investigations right now.  Aortic stenosis Her aortic stenosis is quite severe. I suspect her shortness of breath is related to severe aortic stenosis. I would defer to her cardiologist for management.  Chronic kidney disease She has chronic kidney disease with leg edema. She is currently on a diuretic therapy. I would defer to her primary care physician for further followup.      All questions were answered. The patient knows to call the clinic with any problems, questions or concerns. I spent 30 minutes counseling the patient face to  face. The total time spent in the appointment was 40 minutes and more than 50% was on counseling.     [facility], Adilene Areola, MD 03/08/2014 12:58 PM

## 2014-03-08 NOTE — Assessment & Plan Note (Signed)
She has chronic kidney disease with leg edema. She is currently on a diuretic therapy. I would defer to her primary care physician for further followup.

## 2014-03-08 NOTE — Progress Notes (Signed)
Checked in new patient with no financial issues prior to seeing the dr. She had no ID-- she has appt card and [place].

## 2014-03-08 NOTE — Assessment & Plan Note (Signed)
She is noted to have very mild monocytosis. Overall, she is not symptomatic. CMML cannot be excluded however given the stability of her monocyte count and total white count, she does not need further investigations right now.

## 2014-07-18 ENCOUNTER — Other Ambulatory Visit: Payer: Self-pay | Admitting: Interventional Cardiology

## 2014-08-01 ENCOUNTER — Other Ambulatory Visit: Payer: Self-pay | Admitting: Interventional Cardiology

## 2014-10-22 ENCOUNTER — Ambulatory Visit (INDEPENDENT_AMBULATORY_CARE_PROVIDER_SITE_OTHER): Payer: Medicare Other | Admitting: Physician Assistant

## 2014-10-22 ENCOUNTER — Encounter: Payer: Self-pay | Admitting: Physician Assistant

## 2014-10-22 ENCOUNTER — Other Ambulatory Visit: Payer: Self-pay | Admitting: *Deleted

## 2014-10-22 VITALS — BP 140/72 | HR 81 | Ht <= 58 in | Wt 117.0 lb

## 2014-10-22 DIAGNOSIS — I10 Essential (primary) hypertension: Secondary | ICD-10-CM

## 2014-10-22 DIAGNOSIS — I350 Nonrheumatic aortic (valve) stenosis: Secondary | ICD-10-CM | POA: Diagnosis not present

## 2014-10-22 MED ORDER — PRAVASTATIN SODIUM 20 MG PO TABS: 20.0000 mg | ORAL_TABLET | Freq: Every day | ORAL | Status: AC

## 2014-10-22 MED ORDER — FUROSEMIDE 20 MG PO TABS: 20.0000 mg | ORAL_TABLET | Freq: Every day | ORAL | Status: DC

## 2014-10-22 NOTE — Patient Instructions (Signed)
Your physician recommends that you continue on your current medications as directed. Please refer to the Current Medication list given to you today.     Your physician wants you to follow-up in:  IN 6 MONTHS WITH DR Eldridge DaceVARANASI  You will receive a reminder letter in the mail two months in advance. If you don't receive a letter, please call our office to schedule the follow-up appointment.

## 2014-10-22 NOTE — Assessment & Plan Note (Signed)
Followed by primary care.   

## 2014-10-22 NOTE — Progress Notes (Signed)
Cardiology Office Note   Date:  10/22/2014   ID:  Sheri Hartman, DOB 09/17/1918, MRN [id number]  PCP:  Sheri AlkenBARNES,ELIZABETH STEWART, MD  Cardiologist:  Sheri Hartman  Chief Complaint:    History of Present Illness: Sheri Hartman is a 81 y.o. female who presents for follow-up of moderate to severe aortic stenosis. She has been evaluated by Sheri Hartman for possible TAVR but has declined any treatment of her aortic stenosis. She last saw Sheri Hartman 07/2013 at which time she was stable. She was taking Lasix daily for leg edema. Last 2-D echo 10/2013 showed normal LV function EF 60-65% with grade 1 diastolic dysfunction. Moderate aortic stenosis with mild AI, mean gradient 30 mmHg valve area 0.97 cm Vmax 0.85 cm it had progressed since last echo in 04/2013.  Patient also has history of anemia felt secondary to chronic kidney disease.  Since her last office visit her activities have certainly slowed down. Her husband says she no longer goes to the grocery store and stops and sits down the elevator when walking to dinner. They live at [facility]. She has less energy than ever before. She has dyspnea on exertion which has probably worsened but she is adjusted with decreasing her activity. She denies chest pain or palpitations, dizziness or presyncope. She has had no fluid overload. She has chronic ankle edema that has not changed.   Past Medical History  Diagnosis Date  . Hypertension   . History of rheumatic fever   . Degenerative arthritis   . Hyperlipidemia   . Stress incontinence   . Diverticulosis   . Chronic kidney disease     STABLE  . History of fractured pelvis   . DDD (degenerative disc disease), lumbar   . OAB (overactive bladder)   . SBE (subacute bacterial endocarditis) prophylaxis candidate   . Osteoporosis, senile   . Macular degeneration   . Aortic stenosis   . Anemia     Past Surgical History  Procedure Laterality Date  . Back surgery      lumbar disc surgery  .  Tonsillectomy    . Eye surgery    . Arthroscopic knee surgery on the left       Current Outpatient Prescriptions  Medication Sig Dispense Refill  . Ascorbic Acid (VITAMIN C PO) Take by mouth daily.    . Cholecalciferol (VITAMIN D PO) Take by mouth daily.    . furosemide (LASIX) 20 MG tablet Take 10 mg by mouth daily.    . Multiple Vitamin (MULTIVITAMIN) tablet Take 1 tablet by mouth daily.    . Multiple Vitamins-Minerals (ICAPS) CAPS Take 1 capsule by mouth daily.    . nitroGLYCERIN (NITRODUR - DOSED IN MG/24 HR) 0.2 mg/hr patch APPLY 1 PATCH TO THE SKIN ONCE A DAY 90 patch 1  . NON FORMULARY Calcium plus zinc and magnesium    . Omega-3 Fatty Acids (OMEGA 3 PO) Take by mouth.    . pravastatin (PRAVACHOL) 20 MG tablet TAKE 1 TABLET BY MOUTH ONCE DAILY 90 tablet 0  . sodium chloride (OCEAN) 0.65 % SOLN nasal spray Place 1 spray into both nostrils as needed for congestion.    Marland Kitchen. VITAMIN E COMPLEX PO Take by mouth daily.     No current facility-administered medications for this visit.    Allergies:   Ciprofloxacin; Tramadol; and Latex    Social History:  The patient  reports that she has never smoked. She has never used smokeless tobacco. She reports that she drinks  alcohol. She reports that she does not use illicit drugs.   Family History:  The patient'sfamily history is not on file.    ROS:  Please see the history of present illness.   Otherwise, review of systems are positive for balance and walking issues she uses a rolling walker.   All other systems are reviewed and negative.    PHYSICAL EXAM: VS:  Ht  (1.473 m) , BMI There is no weight on file to calculate BMI. GEN: Well nourished, well developed, in no acute distress HEENT: normal Neck: no JVD, HJR, carotid bruits, or masses Cardiac: RRR; 3-4/6 heart systolic murmur at the left sternal border and right sternal border, no rubs, thrill or heave,no edema,   Respiratory:  clear to auscultation bilaterally, normal work of  breathing GI: soft, nontender, nondistended, + BS MS: no deformity or atrophy Extremities: without cyanosis, clubbing, edema, good distal pulses bilaterally.  Skin: warm and dry, no rash Neuro:  Strength and sensation are intact Psych: euthymic mood, full affect   EKG:  EKG is ordered today. The ekg ordered today demonstrates normal sinus rhythm PACs no acute change Recent Labs: No results found for requested labs within last 365 days.    Lipid Panel No results found for: CHOL, TRIG, HDL, CHOLHDL, VLDL, LDLCALC, LDLDIRECT    Wt Readings from Last 3 Encounters:  03/08/14 114 lb 1.6 oz (51.755 kg)  10/30/13 128 lb (58.06 kg)  07/11/13 117 lb 1.9 oz (53.125 kg)      Other studies Reviewed: Additional studies/ records that were reviewed today include and review of the records demonstrates: 2-D echo with 10/26/13 Study Conclusions  - Left ventricle: The cavity size was normal. There was mild   concentric hypertrophy. Systolic function was normal. The   estimated ejection fraction was in the range of 60% to   65%. Wall motion was normal; there were no regional wall   motion abnormalities. Doppler parameters are consistent   with abnormal left ventricular relaxation (grade 1   diastolic dysfunction). Doppler parameters are consistent   with high ventricular filling pressure. - Aortic valve: Cusp separation was reduced. There was   moderate stenosis. Mild regurgitation. Peak velocity:   362cm/s (S). Mean gradient: 30mm Hg (S). Valve area:   0.97cm^2(VTI). Valve area: 0.85cm^2 (Vmax). - Mitral valve: Calcified annulus. Impressions:  - When compared to ECHO from 04/28/13 - aortic stenosis has   progressed (prior peak velocity 3.37m/s).    ASSESSMENT AND PLAN: Aortic stenosis Moderate to severe aortic stenosis on 2-D echo in 10/2013. Surgery is not recommended for this 81 year old patient. She has less energy and increase dyspnea on exertion. She has adjusted by decreasing her  activities. No change in medications. Follow-up with SheriVaranasi in 6 months.   Hypertension Blood pressure controlled   Chronic kidney disease Followed by primary care.      Sheri Clan, PA-C  10/22/2014 12:25 PM    [facility] [address] [place], [place], [place]  [address] Phone: [phone]; Fax: [phone]

## 2014-10-22 NOTE — Assessment & Plan Note (Signed)
Blood pressure controlled. 

## 2014-10-22 NOTE — Assessment & Plan Note (Signed)
Moderate to severe aortic stenosis on 2-D echo in 10/2013. Surgery is not recommended for this 81 year old patient. She has less energy and increase dyspnea on exertion. She has adjusted by decreasing her activities. No change in medications. Follow-up with Dr.Varanasi in 6 months.

## 2014-11-13 ENCOUNTER — Telehealth: Payer: Self-pay | Admitting: Interventional Cardiology

## 2014-11-13 NOTE — Telephone Encounter (Signed)
New message ° ° ° ° ° °Talk to the nurse-----he would not tell me what he wanted °

## 2014-11-13 NOTE — Telephone Encounter (Signed)
Returned husband's phone call, DPR on file. Husband states that physical therapist suggested that they contact our office because pt has some slightly increased swelling in bilateral ankles. Pt has no CP, no dizziness, no lightheadedness. Husband states that they haven't checked her BP or pulse today but that they are normally good. Pt has PT Mon, Wed and Fri each week and started this about 3 weeks ago. Informed husband to have his wife elevate her legs to help reduce the swelling. Informed husband that Dr. Eldridge DaceVaranasi was not in office today but that I would route this information to him to review and would call him back with any new orders/suggestions if Dr. Eldridge DaceVaranasi had any. Husband verbalized understanding and was in agreement with this plan.

## 2014-11-14 ENCOUNTER — Telehealth: Payer: Self-pay | Admitting: Interventional Cardiology

## 2014-11-14 ENCOUNTER — Ambulatory Visit: Payer: Medicare Other | Admitting: Interventional Cardiology

## 2014-11-14 NOTE — Telephone Encounter (Signed)
Elevate legs.  Can take an extra lasix 2o mg tab for one day to see if this helps also.

## 2014-11-14 NOTE — Telephone Encounter (Signed)
Informed pt's husband that pt can take an extra Lasix 20mg  for one day to see if this helps and to have wife elevate legs. Husband verbalized understanding and was in agreement with this plan.

## 2014-11-14 NOTE — Telephone Encounter (Signed)
New messge       Pt has one more question to ask Victorino DikeJennifer

## 2014-11-14 NOTE — Telephone Encounter (Signed)
Returned husband's call. Husband wanted to know the generic name for Lasix because he didn't think they had any at home. Informed him it was Furosemide and husband stated that they did have some there and he would have his wife take one extra dose today.

## 2014-12-03 ENCOUNTER — Other Ambulatory Visit: Payer: Self-pay | Admitting: *Deleted

## 2014-12-03 MED ORDER — FUROSEMIDE 20 MG PO TABS: 20.0000 mg | ORAL_TABLET | Freq: Every day | ORAL | Status: DC

## 2015-01-06 ENCOUNTER — Other Ambulatory Visit: Payer: Self-pay | Admitting: Interventional Cardiology

## 2015-02-21 ENCOUNTER — Observation Stay (HOSPITAL_COMMUNITY): Payer: Medicare Other

## 2015-02-21 ENCOUNTER — Emergency Department (HOSPITAL_COMMUNITY): Payer: Medicare Other

## 2015-02-21 ENCOUNTER — Encounter (HOSPITAL_COMMUNITY): Payer: Self-pay | Admitting: Emergency Medicine

## 2015-02-21 ENCOUNTER — Observation Stay (HOSPITAL_COMMUNITY)
Admission: EM | Admit: 2015-02-21 | Discharge: 2015-02-22 | Disposition: A | Discharge status: Skilled Nursing Facility | Payer: Medicare Other | Attending: Internal Medicine | Admitting: Internal Medicine

## 2015-02-21 DIAGNOSIS — N184 Chronic kidney disease, stage 4 (severe): Secondary | ICD-10-CM | POA: Insufficient documentation

## 2015-02-21 DIAGNOSIS — D631 Anemia in chronic kidney disease: Secondary | ICD-10-CM | POA: Diagnosis not present

## 2015-02-21 DIAGNOSIS — I350 Nonrheumatic aortic (valve) stenosis: Secondary | ICD-10-CM

## 2015-02-21 DIAGNOSIS — R2681 Unsteadiness on feet: Secondary | ICD-10-CM | POA: Diagnosis not present

## 2015-02-21 DIAGNOSIS — N179 Acute kidney failure, unspecified: Secondary | ICD-10-CM | POA: Diagnosis not present

## 2015-02-21 DIAGNOSIS — I129 Hypertensive chronic kidney disease with stage 1 through stage 4 chronic kidney disease, or unspecified chronic kidney disease: Secondary | ICD-10-CM | POA: Insufficient documentation

## 2015-02-21 DIAGNOSIS — E785 Hyperlipidemia, unspecified: Secondary | ICD-10-CM | POA: Diagnosis not present

## 2015-02-21 DIAGNOSIS — R0902 Hypoxemia: Principal | ICD-10-CM | POA: Insufficient documentation

## 2015-02-21 DIAGNOSIS — R0600 Dyspnea, unspecified: Secondary | ICD-10-CM | POA: Diagnosis not present

## 2015-02-21 DIAGNOSIS — N189 Chronic kidney disease, unspecified: Secondary | ICD-10-CM | POA: Diagnosis present

## 2015-02-21 DIAGNOSIS — Z79899 Other long term (current) drug therapy: Secondary | ICD-10-CM | POA: Insufficient documentation

## 2015-02-21 DIAGNOSIS — M810 Age-related osteoporosis without current pathological fracture: Secondary | ICD-10-CM | POA: Diagnosis not present

## 2015-02-21 DIAGNOSIS — M1990 Unspecified osteoarthritis, unspecified site: Secondary | ICD-10-CM | POA: Diagnosis not present

## 2015-02-21 DIAGNOSIS — I959 Hypotension, unspecified: Secondary | ICD-10-CM | POA: Diagnosis not present

## 2015-02-21 DIAGNOSIS — Z9981 Dependence on supplemental oxygen: Secondary | ICD-10-CM | POA: Diagnosis not present

## 2015-02-21 DIAGNOSIS — R0609 Other forms of dyspnea: Secondary | ICD-10-CM | POA: Diagnosis present

## 2015-02-21 DIAGNOSIS — Z66 Do not resuscitate: Secondary | ICD-10-CM | POA: Diagnosis not present

## 2015-02-21 LAB — CBC WITH DIFFERENTIAL/PLATELET
BASOS ABS: 0 10*3/uL (ref 0.0–0.1)
Basophils Relative: 0 % (ref 0–1)
EOS ABS: 0.2 10*3/uL (ref 0.0–0.7)
Eosinophils Relative: 2 % (ref 0–5)
HCT: 31.8 % — ABNORMAL LOW (ref 36.0–46.0)
HEMOGLOBIN: 10.7 g/dL — AB (ref 12.0–15.0)
LYMPHS ABS: 1.9 10*3/uL (ref 0.7–4.0)
Lymphocytes Relative: 17 % (ref 12–46)
MCH: 32.3 pg (ref 26.0–34.0)
MCHC: 33.6 g/dL (ref 30.0–36.0)
MCV: 96.1 fL (ref 78.0–100.0)
MONOS PCT: 14 % — AB (ref 3–12)
Monocytes Absolute: 1.5 10*3/uL — ABNORMAL HIGH (ref 0.1–1.0)
NEUTROS ABS: 7.4 10*3/uL (ref 1.7–7.7)
Neutrophils Relative %: 67 % (ref 43–77)
Platelets: 323 10*3/uL (ref 150–400)
RBC: 3.31 MIL/uL — ABNORMAL LOW (ref 3.87–5.11)
RDW: 13.9 % (ref 11.5–15.5)
WBC: 11 10*3/uL — AB (ref 4.0–10.5)

## 2015-02-21 LAB — BASIC METABOLIC PANEL
Anion gap: 7 (ref 5–15)
BUN: 39 mg/dL — ABNORMAL HIGH (ref 6–20)
CALCIUM: 8.7 mg/dL — AB (ref 8.9–10.3)
CO2: 31 mmol/L (ref 22–32)
Chloride: 92 mmol/L — ABNORMAL LOW (ref 101–111)
Creatinine, Ser: 1.66 mg/dL — ABNORMAL HIGH (ref 0.44–1.00)
GFR, EST AFRICAN AMERICAN: 29 mL/min — AB (ref >60)
GFR, EST NON AFRICAN AMERICAN: 25 mL/min — AB (ref >60)
Glucose, Bld: 100 mg/dL — ABNORMAL HIGH (ref 65–99)
POTASSIUM: 4.6 mmol/L (ref 3.5–5.1)
SODIUM: 130 mmol/L — AB (ref 135–145)

## 2015-02-21 LAB — I-STAT TROPONIN, ED: TROPONIN I, POC: 0 ng/mL (ref 0.00–0.08)

## 2015-02-21 LAB — BRAIN NATRIURETIC PEPTIDE: B NATRIURETIC PEPTIDE 5: 224.4 pg/mL — AB (ref 0.0–100.0)

## 2015-02-21 MED ORDER — ONDANSETRON HCL 4 MG/2ML IJ SOLN: 4.0000 mg | Freq: Four times a day (QID) | INTRAMUSCULAR | Status: DC | PRN

## 2015-02-21 MED ORDER — ACETAMINOPHEN 650 MG RE SUPP: 650.0000 mg | Freq: Four times a day (QID) | RECTAL | Status: DC | PRN

## 2015-02-21 MED ORDER — SODIUM CHLORIDE 0.9 % IJ SOLN
3.0000 mL | INTRAMUSCULAR | Status: DC | PRN
Start: 2015-02-21 — End: 2015-02-22

## 2015-02-21 MED ORDER — ACETAMINOPHEN 325 MG PO TABS: 650.0000 mg | ORAL_TABLET | Freq: Four times a day (QID) | ORAL | Status: DC | PRN

## 2015-02-21 MED ORDER — PRAVASTATIN SODIUM 20 MG PO TABS
20.0000 mg | ORAL_TABLET | Freq: Every day | ORAL | Status: DC
  Administered 2015-02-22: 20 mg via ORAL
  Filled 2015-02-21 (×2): qty 1

## 2015-02-21 MED ORDER — ENOXAPARIN SODIUM 30 MG/0.3ML ~~LOC~~ SOLN
30.0000 mg | SUBCUTANEOUS | Status: DC
  Filled 2015-02-21 (×2): qty 0.3

## 2015-02-21 MED ORDER — TECHNETIUM TC 99M DIETHYLENETRIAME-PENTAACETIC ACID: 40.0000 | Freq: Once | INTRAVENOUS | Status: AC | PRN

## 2015-02-21 MED ORDER — SODIUM CHLORIDE 0.9 % IJ SOLN
3.0000 mL | Freq: Two times a day (BID) | INTRAMUSCULAR | Status: DC
  Administered 2015-02-21 – 2015-02-22 (×2): 3 mL via INTRAVENOUS

## 2015-02-21 MED ORDER — DOCUSATE SODIUM 100 MG PO CAPS
100.0000 mg | ORAL_CAPSULE | Freq: Two times a day (BID) | ORAL | Status: DC
  Administered 2015-02-22: 100 mg via ORAL
  Filled 2015-02-21 (×3): qty 1

## 2015-02-21 MED ORDER — ONDANSETRON HCL 4 MG PO TABS: 4.0000 mg | ORAL_TABLET | Freq: Four times a day (QID) | ORAL | Status: DC | PRN

## 2015-02-21 MED ORDER — TECHNETIUM TO 99M ALBUMIN AGGREGATED
6.0000 | Freq: Once | INTRAVENOUS | Status: AC | PRN
  Administered 2015-02-21: 6 via INTRAVENOUS

## 2015-02-21 MED ORDER — ALBUTEROL SULFATE (2.5 MG/3ML) 0.083% IN NEBU: 2.5000 mg | INHALATION_SOLUTION | RESPIRATORY_TRACT | Status: DC | PRN

## 2015-02-21 MED ORDER — SODIUM CHLORIDE 0.9 % IV SOLN: 250.0000 mL | INTRAVENOUS | Status: DC | PRN

## 2015-02-21 NOTE — ED Notes (Signed)
Ambulated pt in the hallway pt sats started off at 95% then decreased to 85%when returning to the room pt states that she felt SOB while walking no other complaints noted at this time

## 2015-02-21 NOTE — ED Provider Notes (Signed)
CSN: [id number]     Arrival date & time 02/21/15  1210 History   First MD Initiated Contact with Patient 02/21/15 1212     Chief Complaint  Patient presents with  . Shortness of Breath     (Consider location/radiation/quality/duration/timing/severity/associated sxs/prior Treatment) Patient is a 81 y.o. female presenting with shortness of breath. The history is provided by the patient. No language interpreter was used.  Shortness of Breath Severity:  Moderate Onset quality:  Sudden Timing:  Intermittent Progression:  Waxing and waning Chronicity:  Recurrent Context: activity   Relieved by:  Rest Worsened by:  Movement Ineffective treatments:  Position changes and rest Associated symptoms: no abdominal pain, no chest pain, no claudication, no cough, no diaphoresis, no fever, no headaches, no neck pain, no sore throat and no vomiting   Associated symptoms comment:  Generalized weakness, malaise   Past Medical History  Diagnosis Date  . Hypertension   . History of rheumatic fever   . Degenerative arthritis   . Hyperlipidemia   . Stress incontinence   . Diverticulosis   . Chronic kidney disease     STABLE  . History of fractured pelvis   . DDD (degenerative disc disease), lumbar   . OAB (overactive bladder)   . SBE (subacute bacterial endocarditis) prophylaxis candidate   . Osteoporosis, senile   . Macular degeneration   . Aortic stenosis   . Anemia    Past Surgical History  Procedure Laterality Date  . Back surgery      lumbar disc surgery  . Tonsillectomy    . Eye surgery    . Arthroscopic knee surgery on the left     Family History  Problem Relation Age of Onset  . Hypertension Mother   . Hypertension Father    History  Substance Use Topics  . Smoking status: Never Smoker   . Smokeless tobacco: Never Used  . Alcohol Use: Yes     Comment: 4 oz wine daily in the evening   OB History    No data available     Review of Systems  Constitutional: Negative  for fever, chills, diaphoresis, activity change, appetite change and fatigue.  HENT: Negative for congestion, facial swelling, rhinorrhea and sore throat.   Eyes: Negative for photophobia and discharge.  Respiratory: Positive for shortness of breath. Negative for cough and chest tightness.   Cardiovascular: Negative for chest pain, palpitations, claudication and leg swelling.  Gastrointestinal: Negative for nausea, vomiting, abdominal pain and diarrhea.  Endocrine: Negative for polydipsia and polyuria.  Genitourinary: Negative for dysuria, frequency, difficulty urinating and pelvic pain.  Musculoskeletal: Negative for back pain, arthralgias, neck pain and neck stiffness.  Skin: Negative for color change and wound.  Allergic/Immunologic: Negative for immunocompromised state.  Neurological: Negative for facial asymmetry, weakness, numbness and headaches.  Hematological: Does not bruise/bleed easily.  Psychiatric/Behavioral: Negative for confusion and agitation.      Allergies  Ciprofloxacin; Tramadol; and Latex  Home Medications   Prior to Admission medications   Medication Sig Start Date End Date Taking? Authorizing Provider  Ascorbic Acid (VITAMIN C PO) Take by mouth daily.    Historical Provider, MD  Cholecalciferol (VITAMIN D PO) Take by mouth daily.    Historical Provider, MD  furosemide (LASIX) 20 MG tablet Take 1 tablet (20 mg total) by mouth daily. 12/03/14   Corky Crafts, MD  losartan-hydrochlorothiazide (HYZAAR) 100-12.5 MG per tablet Take 1 tablet by mouth daily.    Historical Provider, MD  Multiple Vitamin (  MULTIVITAMIN) tablet Take 1 tablet by mouth daily.    Historical Provider, MD  Multiple Vitamins-Minerals (ICAPS) CAPS Take 1 capsule by mouth daily.    Historical Provider, MD  nitroGLYCERIN (NITRODUR - DOSED IN MG/24 HR) 0.2 mg/hr patch APPLY 1 PATCH TO SKIN ONCE A DAY 01/08/15   Corky Crafts, MD  NON FORMULARY Calcium plus zinc and magnesium    Historical  Provider, MD  Omega-3 Fatty Acids (OMEGA 3 PO) Take by mouth.    Historical Provider, MD  pravastatin (PRAVACHOL) 20 MG tablet Take 1 tablet (20 mg total) by mouth daily. 10/22/14   Dyann Kief, PA-C  sodium chloride (OCEAN) 0.65 % SOLN nasal spray Place 1 spray into both nostrils as needed for congestion.    Historical Provider, MD  VITAMIN E COMPLEX PO Take by mouth daily.    Historical Provider, MD   BP 144/69 mmHg  Pulse 95  Temp(Src) 98.7 F (37.1 C) (Oral)  Resp 10  SpO2 96% Physical Exam  Constitutional: She is oriented to person, place, and time. She appears well-developed and well-nourished. No distress.  HENT:  Head: Normocephalic and atraumatic.  Mouth/Throat: No oropharyngeal exudate.  Eyes: Pupils are equal, round, and reactive to light.  Neck: Normal range of motion. Neck supple.  Cardiovascular: Normal rate, regular rhythm and normal heart sounds.  Exam reveals no gallop and no friction rub.   No murmur heard. Pulmonary/Chest: Effort normal and breath sounds normal. No respiratory distress. She has no wheezes. She has no rales.  Abdominal: Soft. Bowel sounds are normal. She exhibits no distension and no mass. There is no tenderness. There is no rebound and no guarding.  Musculoskeletal: Normal range of motion. She exhibits edema. She exhibits no tenderness.  Neurological: She is alert and oriented to person, place, and time.  Skin: Skin is warm and dry.  Psychiatric: She has a normal mood and affect.    ED Course  Procedures (including critical care time) Labs Review Labs Reviewed  CBC WITH DIFFERENTIAL/PLATELET - Abnormal; Notable for the following:    WBC 11.0 (*)    RBC 3.31 (*)    Hemoglobin 10.7 (*)    HCT 31.8 (*)    Monocytes Relative 14 (*)    Monocytes Absolute 1.5 (*)    All other components within normal limits  BASIC METABOLIC PANEL - Abnormal; Notable for the following:    Sodium 130 (*)    Chloride 92 (*)    Glucose, Bld 100 (*)    BUN 39  (*)    Creatinine, Ser 1.66 (*)    Calcium 8.7 (*)    GFR calc non Af Amer 25 (*)    GFR calc Af Amer 29 (*)    All other components within normal limits  BRAIN NATRIURETIC PEPTIDE - Abnormal; Notable for the following:    B Natriuretic Peptide 224.4 (*)    All other components within normal limits  I-STAT TROPOININ, ED    Imaging Review Dg Chest 2 View  02/21/2015   CLINICAL DATA:  Dyspnea.  EXAM: CHEST  2 VIEW  COMPARISON:  February 02, 2013.  FINDINGS: The heart size and mediastinal contours are within normal limits. Both lungs are clear. No pneumothorax or pleural effusion is noted. Stable compression deformity of upper thoracic vertebral body is noted consistent with old fracture.  IMPRESSION: No active cardiopulmonary disease.   Electronically Signed   By: Lupita Raider, M.D.   On: 02/21/2015 15:22  EKG Interpretation None      MDM   Final diagnoses:  DOE (dyspnea on exertion)  Hypoxia    Pt is a 81 y.o. female with Pmhx as above who presents with DOE.  Nursing home reports symptoms worsened this afternoon after patient was walking from her apartment to her husband's apartment at the assisted living today.  She was reported by the nurse to have been tripoding.  Patient denies chest pain, fevers, cough.  She is not increased pedal edema.  On physical exam, patient has no acute complaint is somewhat confused.  Lungs are clear.  She has a cardiac murmur which is chronic and consistent with history of aortic stenosis She has 2+ pitting edema bilaterally.  Troponin is not elevated.  BNP is mildly elevated.  Chest x-ray is normal  With ambulation.  Patient's O2 sats dropped down to the mid to low 80s and she became acutely dyspneic.  Given no known prior oxygen requirement and no home oxygen.  [facility] will admit for observation.       Toy CookeyMegan Docherty, MD 02/21/15 [phone]

## 2015-02-21 NOTE — ED Notes (Signed)
Transferred to NM

## 2015-02-21 NOTE — H&P (Addendum)
[facility] History and Physical  Sheri Hartman EUM:[id number] DOB: December 06, 1918 DOA: 02/21/2015   PCP: Gerrit Heck, MD  Specialists: Dr. Irish Lack is her cardiologist.  Chief Complaint: Low blood pressure and low oxygen levels  HPI: Sheri Hartman is a 82 y.o. female with a past medical history of moderate to severe aortic stenosis, hypertension, chronic kidney disease, anemia, who lives in an independent living facility. Patient appears to have cognitive impairment and was unable to provide much history. She is accompanied by her husband and daughter, who provided most of the information. Apparently, her husband was hospitalized for the past 3 days and the patient may not have been taking her medications appropriately per the daughter. This morning when the nurse came to assess her husband Patient asked that she also be assessed. Although she cannot provide a reason for that request. At that time she was found to have a low blood pressure in the 80s and 90s. She was not symptomatic. And when her oxygen saturations level were checked, it was in the 80s as well. She did not have any chest pain. She did not feel any more short of breath than her baseline. No lightheadedness. Denies any new medications recently. No nausea, vomiting. No fever, chills, no recent diarrhea. She has had decreased sleep over the last few days. History is limited.  Home Medications: This medication list has not been reconciled yet. Though per family there has not been any changes. Prior to Admission medications   Medication Sig Start Date End Date Taking? Authorizing Provider  Ascorbic Acid (VITAMIN C PO) Take by mouth daily.    Historical Provider, MD  Cholecalciferol (VITAMIN D PO) Take by mouth daily.    Historical Provider, MD  furosemide (LASIX) 20 MG tablet Take 1 tablet (20 mg total) by mouth daily. 12/03/14   Jettie Booze, MD  losartan-hydrochlorothiazide (HYZAAR) 100-12.5 MG per tablet  Take 1 tablet by mouth daily.    Historical Provider, MD  Multiple Vitamin (MULTIVITAMIN) tablet Take 1 tablet by mouth daily.    Historical Provider, MD  Multiple Vitamins-Minerals (ICAPS) CAPS Take 1 capsule by mouth daily.    Historical Provider, MD  nitroGLYCERIN (NITRODUR - DOSED IN MG/24 HR) 0.2 mg/hr patch APPLY 1 PATCH TO SKIN ONCE A DAY 01/08/15   Jettie Booze, MD  NON FORMULARY Calcium plus zinc and magnesium    Historical Provider, MD  Omega-3 Fatty Acids (OMEGA 3 PO) Take by mouth.    Historical Provider, MD  pravastatin (PRAVACHOL) 20 MG tablet Take 1 tablet (20 mg total) by mouth daily. 10/22/14   Imogene Burn, PA-C  sodium chloride (OCEAN) 0.65 % SOLN nasal spray Place 1 spray into both nostrils as needed for congestion.    Historical Provider, MD  VITAMIN E COMPLEX PO Take by mouth daily.    Historical Provider, MD    Allergies:  Allergies  Allergen Reactions  . Ciprofloxacin Other (See Comments)    dizziness and disorientation  . Tramadol Other (See Comments)    Disorientation, disconnected   . Latex Other (See Comments)    Unknown....years ago....eczema    Past Medical History: Past Medical History  Diagnosis Date  . Hypertension   . History of rheumatic fever   . Degenerative arthritis   . Hyperlipidemia   . Stress incontinence   . Diverticulosis   . Chronic kidney disease     STABLE  . History of fractured pelvis   . DDD (degenerative disc disease), lumbar   .  OAB (overactive bladder)   . SBE (subacute bacterial endocarditis) prophylaxis candidate   . Osteoporosis, senile   . Macular degeneration   . Aortic stenosis   . Anemia     Past Surgical History  Procedure Laterality Date  . Back surgery      lumbar disc surgery  . Tonsillectomy    . Eye surgery    . Arthroscopic knee surgery on the left      Social History: She currently resides in an independent living facility. She denies history of smoking. Has a glass of wine every night.  Uses a walker to ambulate.  Family History:  Family History  Problem Relation Age of Onset  . Hypertension Mother   . Hypertension Father      Review of Systems - unable to do due to her confusion  Physical Examination  Filed Vitals:   02/21/15 1217 02/21/15 1222 02/21/15 1300 02/21/15 1614  BP: 116/59  104/45 144/69  Pulse:   66 95  Temp: 98.7 F (37.1 C)     TempSrc: Oral     Resp: _0 SpO2: 95% 100% 100% 96%    BP 144/69 mmHg  Pulse 95  Temp(Src) 98.7 F (37.1 C) (Oral)  Resp 10  SpO2 96%  General appearance: alert, cooperative, appears stated age, distracted and no distress Head: Normocephalic, without obvious abnormality, atraumatic Eyes: conjunctivae/corneas clear. PERRL, EOM's intact.  Throat: lips, mucosa, and tongue normal; teeth and gums normal Neck: no adenopathy, no carotid bruit, no JVD, supple, symmetrical, trachea midline and thyroid not enlarged, symmetric, no tenderness/mass/nodules Back: kyphosis noted Resp: Decreased air entry at the bases but no crackles, wheezing or rhonchi. Cardio: S1, S2 is normal, regular. Systolic murmur appreciated 4/6 and over the entire precordium. No S3, S4. No rubs, or bruit. Some pedal edema. GI: soft, non-tender; bowel sounds normal; no masses,  no organomegaly Extremities: Some chronic venous status is noted. Edema noted. Per family, this is unchanged. No calf tenderness. Pulses: 2+ and symmetric Skin: Skin color, texture, turgor normal. No rashes or lesions Lymph nodes: Cervical, supraclavicular, and axillary nodes normal. Neurologic: Alert. Oriented to person. Place. No focal neurological deficits.  Laboratory Data: Results for orders placed or performed during the hospital encounter of 02/21/15 (from the past 48 hour(s))  CBC with Differential/Platelet     Status: Abnormal   Collection Time: 02/21/15  1:11 PM  Result Value Ref Range   WBC 11.0 (H) 4.0 - 10.5 K/uL   RBC 3.31 (L) 3.87 - 5.11 MIL/uL    Hemoglobin 10.7 (L) 12.0 - 15.0 g/dL   HCT 31.8 (L) 36.0 - 46.0 %   MCV 96.1 78.0 - 100.0 fL   MCH 32.3 26.0 - 34.0 pg   MCHC 33.6 30.0 - 36.0 g/dL   RDW 13.9 11.5 - 15.5 %   Platelets 323 150 - 400 K/uL   Neutrophils Relative % 67 43 - 77 %   Lymphocytes Relative 17 12 - 46 %   Monocytes Relative 14 (H) 3 - 12 %   Eosinophils Relative 2 0 - 5 %   Basophils Relative 0 0 - 1 %   Neutro Abs 7.4 1.7 - 7.7 K/uL   Lymphs Abs 1.9 0.7 - 4.0 K/uL   Monocytes Absolute 1.5 (H) 0.1 - 1.0 K/uL   Eosinophils Absolute 0.2 0.0 - 0.7 K/uL   Basophils Absolute 0.0 0.0 - 0.1 K/uL   Smear Review MORPHOLOGY UNREMARKABLE   Basic metabolic panel  Status: Abnormal   Collection Time: 02/21/15  1:11 PM  Result Value Ref Range   Sodium 130 (L) 135 - 145 mmol/L   Potassium 4.6 3.5 - 5.1 mmol/L   Chloride 92 (L) 101 - 111 mmol/L   CO2 31 22 - 32 mmol/L   Glucose, Bld 100 (H) 65 - 99 mg/dL   BUN 39 (H) 6 - 20 mg/dL   Creatinine, Ser 1.66 (H) 0.44 - 1.00 mg/dL   Calcium 8.7 (L) 8.9 - 10.3 mg/dL   GFR calc non Af Amer 25 (L) >60 mL/min   GFR calc Af Amer 29 (L) >60 mL/min    Comment: (NOTE) The eGFR has been calculated using the CKD EPI equation. This calculation has not been validated in all clinical situations. eGFR's persistently <60 mL/min signify possible Chronic Kidney Disease.    Anion gap 7 5 - 15  Brain natriuretic peptide     Status: Abnormal   Collection Time: 02/21/15  1:11 PM  Result Value Ref Range   B Natriuretic Peptide 224.4 (H) 0.0 - 100.0 pg/mL  I-stat troponin, ED     Status: None   Collection Time: 02/21/15  1:29 PM  Result Value Ref Range   Troponin i, poc 0.00 0.00 - 0.08 ng/mL   Comment 3            Comment: Due to the release kinetics of cTnI, a negative result within the first hours of the onset of symptoms does not rule out myocardial infarction with certainty. If myocardial infarction is still suspected, repeat the test at appropriate intervals.     Radiology  Reports: Dg Chest 2 View  02/21/2015   CLINICAL DATA:  Dyspnea.  EXAM: CHEST  2 VIEW  COMPARISON:  February 02, 2013.  FINDINGS: The heart size and mediastinal contours are within normal limits. Both lungs are clear. No pneumothorax or pleural effusion is noted. Stable compression deformity of upper thoracic vertebral body is noted consistent with old fracture.  IMPRESSION: No active cardiopulmonary disease.   Electronically Signed   By: Marijo Conception, M.D.   On: 02/21/2015 15:22    My interpretation of Electrocardiogram: Poor quality EKG with appears to be sinus rhythm with rate in the 70s. Normal axis. Intervals appear to be normal. No concerning ST or T-wave changes.  Problem List  Principal Problem:   Hypoxia Active Problems:   Dyspnea   Osteoporosis   Anemia in chronic kidney disease   Aortic stenosis   Chronic kidney disease   Assessment: This is a 81 year old Caucasian female with the past medical history as stated earlier, who presents after blow blood pressure measurements at her independent living facility along with low oxygen saturations. Blood pressure here has been low but normal. Her oxygen saturations did drop to 85% with the exertion. She is noted to have moderate to severe aortic stenosis, which could be contributing to her symptoms of dyspnea. Chest x-ray does not show any pulmonary edema. Venous thromboembolism is a possibility, though less likely. Worsening in her aortic stenosis is also possibility. She will likely end up needing home oxygen.  Plan: #1 Hypotension: Blood pressure appears to have stabilized. The reason for it isn't not entirely clear. Inappropriate Medication use could've contributed. We will hold her antihypertensives for now. Observe the patient overnight. Her nitroglycerin patch was removed earlier by EMS.  #2 Exertional dyspnea and hypoxia: Most likely due to her moderate to severe aortic stenosis. We'll get an echocardiogram to evaluate the degree  of  severely. It does not change, however, the management of the valvular disease in that she's not a candidate for any intervention. We'll also get a VQ scan. She will need home oxygen at the time of discharge. Physical therapy and occupational therapy evaluation will be ordered at which time Oxygen saturation levels can be repeated.  #3 Chronic kidney disease stage IV: Stable.  #4 anemia and chronic kidney disease: Hemoglobin is stable.   DVT Prophylaxis: Lovenox Code Status: This was discussed with the patient's family. DO NOT RESUSCITATE Family Communication: Discussed with the patient, her husband and her daughter.  Disposition Plan: Observe to telemetry. She will need home oxygen. Anticipate return to [facility] tomorrow. PT and OT evaluation will also be ordered as discussed above.   Further management decisions will depend on results of further testing and patient's response to treatment.   [facility]  [facility] Pager [phone]  If 7PM-7AM, please contact night-coverage [email or web address] Password [facility]  02/21/2015, 5:12 PM

## 2015-02-21 NOTE — ED Notes (Signed)
[facility] with c/o SOB. Staff was ambulating pt and noticed that she became more SOB. Pt denies any pain. Staff at facility stated that BP has been fluctuating and EMS removed Nitro patch when they noticed Last 2 BPs 98 systolic.

## 2015-02-22 ENCOUNTER — Observation Stay (HOSPITAL_COMMUNITY): Payer: Medicare Other

## 2015-02-22 DIAGNOSIS — N189 Chronic kidney disease, unspecified: Secondary | ICD-10-CM | POA: Diagnosis not present

## 2015-02-22 DIAGNOSIS — R0902 Hypoxemia: Secondary | ICD-10-CM

## 2015-02-22 DIAGNOSIS — I951 Orthostatic hypotension: Secondary | ICD-10-CM

## 2015-02-22 DIAGNOSIS — D631 Anemia in chronic kidney disease: Secondary | ICD-10-CM

## 2015-02-22 DIAGNOSIS — I350 Nonrheumatic aortic (valve) stenosis: Secondary | ICD-10-CM

## 2015-02-22 DIAGNOSIS — N183 Chronic kidney disease, stage 3 (moderate): Secondary | ICD-10-CM

## 2015-02-22 LAB — BASIC METABOLIC PANEL
Anion gap: 8 (ref 5–15)
BUN: 35 mg/dL — ABNORMAL HIGH (ref 6–20)
CO2: 30 mmol/L (ref 22–32)
Calcium: 8.5 mg/dL — ABNORMAL LOW (ref 8.9–10.3)
Chloride: 94 mmol/L — ABNORMAL LOW (ref 101–111)
Creatinine, Ser: 1.51 mg/dL — ABNORMAL HIGH (ref 0.44–1.00)
GFR calc non Af Amer: 28 mL/min — ABNORMAL LOW (ref >60)
GFR, EST AFRICAN AMERICAN: 33 mL/min — AB (ref >60)
Glucose, Bld: 88 mg/dL (ref 65–99)
POTASSIUM: 4.3 mmol/L (ref 3.5–5.1)
SODIUM: 132 mmol/L — AB (ref 135–145)

## 2015-02-22 LAB — CBC
HCT: 34.2 % — ABNORMAL LOW (ref 36.0–46.0)
HEMOGLOBIN: 11.1 g/dL — AB (ref 12.0–15.0)
MCH: 31.3 pg (ref 26.0–34.0)
MCHC: 32.5 g/dL (ref 30.0–36.0)
MCV: 96.3 fL (ref 78.0–100.0)
PLATELETS: 335 10*3/uL (ref 150–400)
RBC: 3.55 MIL/uL — ABNORMAL LOW (ref 3.87–5.11)
RDW: 13.9 % (ref 11.5–15.5)
WBC: 10.3 10*3/uL (ref 4.0–10.5)

## 2015-02-22 LAB — TROPONIN I: Troponin I: 0.03 ng/mL (ref <0.031)

## 2015-02-22 MED ORDER — LOSARTAN POTASSIUM 100 MG PO TABS: 100.0000 mg | ORAL_TABLET | Freq: Every day | ORAL | Status: DC

## 2015-02-22 MED ORDER — LOSARTAN POTASSIUM 100 MG PO TABS: 50.0000 mg | ORAL_TABLET | Freq: Every day | ORAL | Status: AC

## 2015-02-22 NOTE — Progress Notes (Signed)
SATURATION QUALIFICATIONS: (This note is used to comply with regulatory documentation for home oxygen)  Patient Saturations on Room Air at Rest = 86%  Patient Saturations on Room Air while Ambulating = 80%  Patient Saturations on 4 Liters of oxygen while Ambulating = >90%  Please briefly explain why patient needs home oxygen:Needed 4LO2 with ambulation to keep sats >90%.  Needed 2LO2 at rest to keep sats >90%.  THanks. [facility] R[facility] Hartman,PT Acute Rehabilitation [phone] (pager)

## 2015-02-22 NOTE — Progress Notes (Signed)
[facility] called for home 02 to be delivered to the patient's room today prior to discharging back to the Independent Living Facility. CM talked to Rosey Batheresa about HHC needs, she stated that it would be arranged at the facility and that they are in the process of moving to the skilled section. Abelino DerrickB Mabry Santarelli [facility] IncRN,MHA,BSN [phone]

## 2015-02-22 NOTE — Clinical Social Work Placement (Addendum)
   CLINICAL SOCIAL WORK PLACEMENT  NOTE  Date:  02/22/2015  Patient Details  Name: Sheri Hartman MRN: [id number] Date of Birth: 06/12/1919  Clinical Social Work is seeking post-discharge placement for this patient at the Skilled  Nursing Facility level of care (*CSW will initial, date and re-position this form in  chart as items are completed):  No (Place in [facility]- increased level of care)   Patient/family provided with [facility] Clinical Social Work Department's list of facilities offering this level of care within the geographic area requested by the patient (or if unable, by the patient's family).  No   Patient/family informed of their freedom to choose among providers that offer the needed level of care, that participate in Medicare, Medicaid or managed care program needed by the patient, have an available bed and are willing to accept the patient.  No   Patient/family informed of [facility] ownership interest in [facility] and [facility], as well as of the fact that they are under no obligation to receive care at these facilities.  PASRR submitted to EDS on 02/22/15     PASRR number received on 02/22/15     Existing PASRR number confirmed on       FL2 transmitted to all facilities in geographic area requested by pt/family on 02/22/15 (to [facility] only)     FL2 transmitted to all facilities within larger geographic area on       Patient informed that his/her managed care company has contracts with or will negotiate with certain facilities, including the following:   (Medicare)     Yes (Semi private room at [facility] [place])   Patient/family informed of bed offers received.  [facility] [place]     Physician recommends and patient chooses bed at      [facility].  Patient to be transferred to facility by Ambulance Sharin Mons(PTAR)     Patient family  notified on 02/22/15 of transfer.  Name of family member notified:  Daughter:  Gabriel RungLucinda Frost       PHYSICIAN Please prepare priority discharge summary, including medications, Please sign FL2, Please prepare prescriptions, Please sign DNR     Additional Comment:  Ok per MD for d/c today SNF today.  She does not have a qualifying 3 day hospital stay for Medicare coverage and this had to be explained to daughter Glee ArvinLucinda. She was able to verbalize an understanding of this but does not feel that her mother is safe to return to independent living with her husband. [facility] staff [facility] agreed with this assessment and want her to come to the SNF unit. Nursing to call report.  Patient states she is glad to go to the SNF but wants to leave as soon as possible. She was a little frustrated that she had to wait for EMS to pick her up but was understanding when d/c delays were explained.  CSW signing off.  Lorri Frederickonna T. Andria RheinCrowder, LCSW [phone]      _______________________________________________ Darylene Pricerowder, Jerrard Bradburn T, LCSW 02/22/2015, 4:55 PM

## 2015-02-22 NOTE — Evaluation (Signed)
Physical Therapy Evaluation Patient Details Name: Sheri Hartman MRN: [id number] DOB: [birth year] Today's Date: 02/22/2015   History of Present Illness  Sheri Hartman is a 81 y.o. female with a past medical history of moderate to severe aortic stenosis, hypertension, chronic kidney disease, anemia, who lives in an independent living facility. Patient appears to have cognitive impairment and was unable to provide much history. She is accompanied by her husband and daughter, who provided most of the information. Apparently, her husband was hospitalized for the past 3 days and the patient may not have been taking her medications appropriately per the daughter. This morning when the nurse came to assess her husband Patient asked that she also be assessed. Although she cannot provide a reason for that request. At that time she was found to have a low blood pressure in the 80s and 90s. She was not symptomatic. And when her oxygen saturations level were checked, it was in the 80s as well. She did not have any chest pain. She did not feel any more short of breath than her baseline. No lightheadedness. Denies any new medications recently. No nausea, vomiting. No fever, chills, no recent diarrhea. She has had decreased sleep over the last few days. History is limited.  Clinical Impression  Pt admitted with above diagnosis. Pt currently with functional limitations due to the deficits listed below (see PT Problem List). Pt able to ambulate with RW with min assist with decr safety awareness.  Desats on RA  without O2.  Pt and daughter agree that SNF for therapy would benefit pt to incr safety and endurance and they prefer [facility].  Will continue to follow acutely.     Pt will benefit from skilled PT to increase their independence and safety with mobility to allow discharge to the venue listed below.      Follow Up Recommendations SNF;Supervision/Assistance - 24 hour    Equipment Recommendations   None recommended by PT    Recommendations for Other Services       Precautions / Restrictions Precautions Precautions: Fall Restrictions Weight Bearing Restrictions: No      Mobility  Bed Mobility               General bed mobility comments: In chair on arrival.   Transfers Overall transfer level: Needs assistance Equipment used: Rolling walker (2 wheeled) Transfers: Sit to/from Stand Sit to Stand: Min guard         General transfer comment: Pt able to stand but needs min guard assist for safety.    Ambulation/Gait Ambulation/Gait assistance: Min guard;+2 safety/equipment Ambulation Distance (Feet): 20 Feet (10 feet x 2) Assistive device: Rolling walker (2 wheeled) Gait Pattern/deviations: Step-through pattern;Decreased stride length;Drifts right/left;Wide base of support;Trunk flexed   Gait velocity interpretation: Below normal speed for age/gender General Gait Details: Pt had difficulty with sequencing steps and RW.  NEeds cues to stay close to RW and to steer RW.  Pt is used to 4 wheeled walker.  Overall poor safety awareness.  DOE3/4.  Pt fatigues quickly.    Stairs            Wheelchair Mobility    Modified Rankin (Stroke Patients Only)       Balance Overall balance assessment: Needs assistance         Standing balance support: No upper extremity supported;During functional activity Standing balance-Leahy Scale: Poor Standing balance comment: Needs at least one upper extremity supported on sink to wash hands.  Decr balance  reactions.                              Pertinent Vitals/Pain Pain Assessment: No/denies pain    SATURATION QUALIFICATIONS: (This note is used to comply with regulatory documentation for home oxygen)  Patient Saturations on Room Air at Rest = 86%  Patient Saturations on Room Air while Ambulating = 80%  Patient Saturations on 4 Liters of oxygen while Ambulating = >90%  Please briefly explain why patient  needs home oxygen:Needed 4LO2 with ambulation to keep sats >90%. Needed 2LO2 at rest to keep sats >90%.   Home Living Family/patient expects to be discharged to:: Other (Comment) ([facility]) Living Arrangements: Spouse/significant other Available Help at Discharge: Family;Available 24 hours/day Type of Home: Independent living facility Home Access: Level entry;Elevator (7th floor)     Home Layout: One level Home Equipment: Grab bars - toilet;Grab bars - tub/shower;Walker - 4 wheels      Prior Function Level of Independence: Needs assistance   Gait / Transfers Assistance Needed: Only used RW outdoors, I ambulation in apartment  ADL's / Homemaking Assistance Needed: Sponge bathed, dresses herself        Hand Dominance   Dominant Hand: Right    Extremity/Trunk Assessment   Upper Extremity Assessment: Defer to OT evaluation           Lower Extremity Assessment: Generalized weakness      Cervical / Trunk Assessment: Kyphotic  Communication   Communication: No difficulties  Cognition Arousal/Alertness: Awake/alert Behavior During Therapy: WFL for tasks assessed/performed Overall Cognitive Status: History of cognitive impairments - at baseline Area of Impairment: Safety/judgement;Problem solving;Awareness;Following commands;Orientation Orientation Level: Disoriented to;Place;Time;Situation   Memory: Decreased short-term memory Following Commands: Follows one step commands with increased time Safety/Judgement: Decreased awareness of safety;Decreased awareness of deficits Awareness: Intellectual Problem Solving: Difficulty sequencing;Requires verbal cues;Requires tactile cues General Comments: Pt unaware that she called someone to bring her to hospital.  Pt states she is at [facility].      General Comments      Exercises General Exercises - Lower Extremity Long Arc Quad: AROM;Both;10 reps;Seated Hip Flexion/Marching: AROM;Both;10 reps;Seated       Assessment/Plan    PT Assessment Patient needs continued PT services  PT Diagnosis Generalized weakness   PT Problem List Decreased activity tolerance;Decreased balance;Decreased mobility;Decreased knowledge of use of DME;Decreased safety awareness;Decreased knowledge of precautions  PT Treatment Interventions DME instruction;Gait training;Functional mobility training;Therapeutic activities;Therapeutic exercise;Balance training;Patient/family education   PT Goals (Current goals can be found in the Care Plan section) Acute Rehab PT Goals Patient Stated Goal: to go home after rehab PT Goal Formulation: With patient Time For Goal Achievement: 03/08/15 Potential to Achieve Goals: Good    Frequency Min 3X/week   Barriers to discharge Decreased caregiver support (husband just got out of hospital)      Co-evaluation               End of Session Equipment Utilized During Treatment: Gait belt;Oxygen Activity Tolerance: Patient limited by fatigue Patient left: in chair;with call bell/phone within reach;with family/visitor present;with chair alarm set Nurse Communication: Mobility status    Functional Assessment Tool Used: clinical judgment Functional Limitation: Mobility: Walking and moving around Mobility: Walking and Moving Around Current Status (R6045): At least 20 percent but less than 40 percent impaired, limited or restricted Mobility: Walking and Moving Around Goal Status [phone]): At least 1 percent but less than 20 percent  impaired, limited or restricted Mobility: Walking and Moving Around Discharge Status [phone](G8980): At least 1 percent but less than 20 percent impaired, limited or restricted    Time: 1010-1038 PT Time Calculation (min) (ACUTE ONLY): 28 min   Charges:   PT Evaluation $Initial PT Evaluation Tier I: 1 Procedure PT Treatments $Gait Training: 8-22 mins   PT G Codes:   PT G-Codes **NOT FOR INPATIENT CLASS** Functional Assessment Tool Used: clinical  judgment Functional Limitation: Mobility: Walking and moving around Mobility: Walking and Moving Around Current Status ([id number](G8978): At least 20 percent but less than 40 percent impaired, limited or restricted Mobility: Walking and Moving Around Goal Status [phone](G8979): At least 1 percent but less than 20 percent impaired, limited or restricted Mobility: Walking and Moving Around Discharge Status [phone](G8980): At least 1 percent but less than 20 percent impaired, limited or restricted    Berline LopesWhite, Kamoni Depree F 02/22/2015, 12:38 PM  [facility]is Farnan,PT Acute Rehabilitation [phone])

## 2015-02-22 NOTE — Progress Notes (Signed)
Called  [phone] and gave report to Thayer Ohmhris, the nurse that [facility].  IV has been removed and is clean, dry, and intact.  Telemetry has been discontinued and CCMD notified.  Discharge instructions have been explained to daughter of pt, and she had no further questions at this time.  Pt is in no acute distress. Awaiting transport.

## 2015-02-22 NOTE — Evaluation (Signed)
Occupational Therapy Evaluation Patient Details Name: Sheri Hartman MRN: [id number] DOB: 01/24/1919 Today's Date: 02/22/2015    History of Present Illness Sheri Hartman is a 81 y.o. female with a past medical history of moderate to severe aortic stenosis, hypertension, chronic kidney disease, anemia, who lives in an independent living facility. Patient appears to have cognitive impairment and was unable to provide much history. She is accompanied by her husband and daughter, who provided most of the information. Apparently, her husband was hospitalized for the past 3 days and the patient may not have been taking her medications appropriately per the daughter. This morning when the nurse came to assess her husband Patient asked that she also be assessed. Although she cannot provide a reason for that request. At that time she was found to have a low blood pressure in the 80s and 90s. She was not symptomatic. And when her oxygen saturations level were checked, it was in the 80s as well. She did not have any chest pain. She did not feel any more short of breath than her baseline. No lightheadedness. Denies any new medications recently. No nausea, vomiting. No fever, chills, no recent diarrhea. She has had decreased sleep over the last few days. History is limited.   Clinical Impression   Pt was living with her husband in ILF prior to admission, ambulating with a RW and performing self care. Pt presents with significant cognitive impairment, impaired balance and generalized weakness interfering with ability to perform self care and manage IADL.  Pt requires 24 hour supervision and assistance. Plan is for d/c later today to SNF with 02. Will defer further OT to SNF.    Follow Up Recommendations  SNF;Supervision/Assistance - 24 hour    Equipment Recommendations       Recommendations for Other Services       Precautions / Restrictions Precautions Precautions: Fall Restrictions Weight Bearing  Restrictions: No      Mobility Bed Mobility               General bed mobility comments: In chair on arrival.   Transfers Overall transfer level: Needs assistance Equipment used: Rolling walker (2 wheeled) Transfers: Sit to/from Stand Sit to Stand: Min guard;Min assist         General transfer comment: min from toilet, min guard from chair    Balance Overall balance assessment: Needs assistance         Standing balance support: No upper extremity supported;During functional activity Standing balance-Leahy Scale: Poor Standing balance comment: Needs at least one upper extremity supported on sink to wash hands.  Decr balance reactions.                             ADL Overall ADL's : Needs assistance/impaired Eating/Feeding: Set up;Sitting   Grooming: Wash/dry hands;Standing;Minimal assistance Grooming Details (indicate cue type and reason): decreased sequencing, resistant to use of soap, cues to locate paper towels  Upper Body Bathing: Minimal assitance;Sitting   Lower Body Bathing: Minimal assistance;Sit to/from stand   Upper Body Dressing : Minimal assistance;Sitting   Lower Body Dressing: Minimal assistance;Sit to/from stand   Toilet Transfer: Minimal assistance;Ambulation;RW   Toileting- Clothing Manipulation and Hygiene: Minimal assistance;Sit to/from stand       Functional mobility during ADLs: Minimal assistance;Rolling walker General ADL Comments: Pt with difficulty problem solving how to use soap dispenser, toilet paper dispenser and paper towel holder.     Vision Additional  Comments: wears glasses, no change from baseline   Perception     Praxis      Pertinent Vitals/Pain Pain Assessment: No/denies pain     Hand Dominance Right   Extremity/Trunk Assessment Upper Extremity Assessment Upper Extremity Assessment: Overall WFL for tasks assessed   Lower Extremity Assessment Lower Extremity Assessment: Defer to PT evaluation    Cervical / Trunk Assessment Cervical / Trunk Assessment: Kyphotic   Communication Communication Communication: No difficulties   Cognition Arousal/Alertness: Awake/alert Behavior During Therapy: WFL for tasks assessed/performed Overall Cognitive Status: History of cognitive impairments - at baseline Area of Impairment: Safety/judgement;Problem solving;Awareness;Following commands;Orientation Orientation Level: Disoriented to;Place;Time;Situation   Memory: Decreased short-term memory Following Commands: Follows one step commands with increased time Safety/Judgement: Decreased awareness of safety;Decreased awareness of deficits Awareness: Intellectual Problem Solving: Difficulty sequencing;Requires verbal cues;Requires tactile cues General Comments: Pt confused upon awakening from a nap.   General Comments       Exercises Exercises: General Lower Extremity     Shoulder Instructions      Home Living Family/patient expects to be discharged to:: Other (Comment) ([facility]) Living Arrangements: Spouse/significant other Available Help at Discharge: Family;Available 24 hours/day Type of Home: Independent living facility Home Access: Level entry;Elevator     Home Layout: One level     Bathroom Shower/Tub: Tub/shower unit;Curtain   Firefighter: Standard Bathroom Accessibility: Yes   Home Equipment: Grab bars - toilet;Grab bars - tub/shower;Walker - 4 wheels          Prior Functioning/Environment Level of Independence: Needs assistance  Gait / Transfers Assistance Needed: Only used RW outdoors, I ambulation in apartment ADL's / Homemaking Assistance Needed: Sponge bathed, dresses herself        OT Diagnosis: Generalized weakness;Cognitive deficits   OT Problem List:     OT Treatment/Interventions:      OT Goals(Current goals can be found in the care plan section) Acute Rehab OT Goals Patient Stated Goal: to go home after rehab  OT Frequency:      Barriers to D/C:            Co-evaluation              End of Session    Activity Tolerance: Patient tolerated treatment well Patient left: in chair;with call bell/phone within reach;with chair alarm set;with family/visitor present   Time: 1235-1310 OT Time Calculation (min): 35 min Charges:  OT General Charges $OT Visit: 1 Procedure OT Evaluation $Initial OT Evaluation Tier I: 1 Procedure OT Treatments $Self Care/Home Management : 8-22 mins G-Codes: OT G-codes **NOT FOR INPATIENT CLASS** Functional Assessment Tool Used: clinical judgement Functional Limitation: Self care Self Care Current Status (B1478): At least 20 percent but less than 40 percent impaired, limited or restricted Self Care Goal Status (G9562): At least 20 percent but less than 40 percent impaired, limited or restricted Self Care Discharge Status [phone]): At least 20 percent but less than 40 percent impaired, limited or restricted  Evern Bio 02/22/2015, 1:45 PM  [phone]

## 2015-02-22 NOTE — Discharge Summary (Signed)
Physician Discharge Summary  Sheri Hartman UEA:[id number] DOB: [birth year] DOA: 02/21/2015  PCP: Gaye Alken, MD  Admit date: 02/21/2015 Discharge date: 02/22/2015  Time spent: >30 minutes  Recommendations for Outpatient Follow-up:  1. Repeat basic metabolic panel to follow electrolytes and renal function 2. Reassess blood pressure and adjust antihypertensive regimen as needed 3. Please arrange outpatient follow-up with patient's cardiologist to reassess patient our take a stenosis and repeat 2-D echo if she is due for it 4. Oxygen 2L especially on exertion to maintain O2 sat above 90% 5. Low sodium diet 6. Daily weights 7. Repeat CBC to follow hemoglobin trend.   Discharge Diagnoses:  Principal Problem:   Hypoxia Active Problems:   Dyspnea   Osteoporosis   Anemia in chronic kidney disease   Aortic stenosis   Chronic kidney disease   Hypotension Physical deconditioning  Discharge Condition: Stable and improved. Patient has been discharged to skilled nursing facility for further care and physical rehabilitation  Diet recommendation: Heart healthy diet  Filed Weights   02/21/15 1856 02/22/15 0404  Weight: 51.256 kg (113 lb) 50.712 kg (111 lb 12.8 oz)    History of present illness:  81 y.o. female with a past medical history of moderate to severe aortic stenosis, hypertension, chronic kidney disease, anemia, who lives in an independent living facility. Patient appears to have cognitive impairment and was unable to provide much history. She is accompanied by her husband and daughter, who provided most of the information. Apparently, her husband was hospitalized for the past 3 days and the patient may not have been taking her medications appropriately per the daughter. Patient was found to have a systolic blood pressure in the 80s to 90s. She was asymptomatic was complaining of some shortness of breath. Oxygen saturation were checked and found to be in the 80s after just  mild exertion. Patient denies any chest pain, fever, chills, cough, lightheadedness or any other acute complaints.  Hospital Course:  1-orthostatic hypertension: Appears to be secondary to Missed use of her medications (probably over taking). -Essentially resolved with IV fluid resuscitation -Her medication has been adjusted to prevent future hypertension (HCTZ has been discontinue her Cozaar chain suggest 50 mg daily). -Patient has been instructed to follow a low-sodium diet and to maintain good hydration.  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  -Troponin negative and patient without chest pain. -Oxygen supplementation has been arranged to use especially on exertion  3-acute on chronic kidney disease superimposed on a stage IV: Due to continue use of nephrotoxic agents and hypotension. -Back to baseline after judicious fluid resuscitation  4-anemia of chronic kidney disease: Hemoglobin stable and no signs of upper bleeding.  5-physical deconditioning: Patient seen by physical therapy in a patient of therapy and recommendation has been for the patient to be discharged to skilled nursing facility for rehabilitation.  *The rest of her medical problems has remained stable and the plan is to continue current medication regimen. Patient will follow-up with her PCP in approximately 10 days after being discharge.  Procedures:  See below for x-ray reports  Consultations:  None  Discharge Exam: Filed Vitals:   02/22/15 0404  BP: 116/69  Pulse: 93  Temp: 97.6 F (36.4 C)  Resp:  16    General: Afebrile, denies chest pain; no acute distress. Patient with oxygen desaturation with activity and good recovery  with the use of 2 L supplementation Cardiovascular: S1 and S2, positive systolic ejection murmur, no rubs, no gallops, no JVD Respiratory: No wheezing, good air movement, no crackles appreciated on exam Abdomen: Soft, nontender, nondistended, positive bowel sounds Extremities: No edema or cyanosis  Discharge Instructions   Discharge Instructions    Diet - low sodium heart healthy    Complete by:  As directed      Discharge instructions    Complete by:  As directed   Oxygen 2L especially on exertion to maintain O2 sat above 90% Low sodium diet Daily weights Medications as prescribed Please arrange follow up with PCP in 10 days          Current Discharge Medication List    START taking these medications   Details  losartan (COZAAR) 100 MG tablet Take 0.5 tablets (50 mg total) by mouth daily.      CONTINUE these medications which have NOT CHANGED   Details  Ascorbic Acid (VITAMIN C PO) Take 1 tablet by mouth daily.     Cholecalciferol (VITAMIN D PO) Take 1 tablet by mouth daily.     furosemide (LASIX) 20 MG tablet Take 1 tablet (20 mg total) by mouth daily. Qty: 90 tablet, Refills: 1    Multiple Minerals (CALCIUM-MAGNESIUM-ZINC) TABS Take 1 tablet by mouth daily.    Multiple Vitamin (MULTIVITAMIN) tablet Take 1 tablet by mouth daily.    Multiple Vitamins-Minerals (ICAPS) CAPS Take 1 capsule by mouth daily.    nitroGLYCERIN (NITRODUR - DOSED IN MG/24 HR) 0.2 mg/hr patch APPLY 1 PATCH TO SKIN ONCE A DAY Qty: 90 patch, Refills: 0    Omega-3 Fatty Acids (OMEGA 3 PO) Take 1 tablet by mouth daily.     pravastatin (PRAVACHOL) 20 MG tablet Take 1 tablet (20 mg total) by mouth daily. Qty: 90 tablet, Refills: 3    sodium chloride (OCEAN) 0.65 % SOLN nasal spray Place 1 spray into both nostrils as needed for congestion.    VITAMIN E COMPLEX PO Take 1 tablet by mouth daily.       STOP taking these medications     losartan-hydrochlorothiazide (HYZAAR) 100-12.5 MG per tablet         Allergies  Allergen Reactions  . Ciprofloxacin Other (See Comments)    dizziness and disorientation  . Tramadol Other (See Comments)    Disorientation, disconnected   . Latex Other (See Comments)    Unknown....years ago....eczema    The results of significant diagnostics from this hospitalization (including imaging, microbiology, ancillary and laboratory) are listed below for reference.    Significant Diagnostic Studies: Dg Chest 2 View  02/21/2015   CLINICAL DATA:  Dyspnea.  EXAM: CHEST  2 VIEW  COMPARISON:  February 02, 2013.  FINDINGS: The heart size and mediastinal contours are within normal limits. Both lungs are clear. No pneumothorax or pleural effusion is noted. Stable compression deformity of upper thoracic vertebral body is noted consistent with old fracture.  IMPRESSION: No active cardiopulmonary disease.   Electronically Signed   By: Lupita Raider, M.D.   On: 02/21/2015 15:22   Nm Pulmonary Perf And Vent  02/21/2015   CLINICAL DATA:  Exertional dyspnea. History of aortic stenosis and hypertension as well as chronic kidney disease.  EXAM: NUCLEAR MEDICINE VENTILATION - PERFUSION LUNG SCAN  TECHNIQUE: Ventilation images were obtained in multiple projections using  inhaled aerosol Tc-6819m DTPA. Perfusion images were obtained in multiple projections after intravenous injection of Tc-4419m MAA.  RADIOPHARMACEUTICALS:  40 mCi of Technetium-7219m DTPA aerosol inhalation and 6.0 mCi of Technetium-919m MAA IV  COMPARISON:  Current chest radiograph  FINDINGS: Ventilation: There is heterogeneous perfusion to both lungs with multiple irregular areas of decreased ventilation.  Perfusion: There are patchy areas of decreased profusion which appear nonsegmental, greater on the left. These areas correlate with the areas of decreased ventilation, but are less extensive.  IMPRESSION: 1. Low probability study for pulmonary thromboembolism. 2. Multiple nonsegmental areas of relative decreased profusion,  which show similar but more extensive areas of decreased ventilation on the ventilation portion of the exam. Findings support small airways disease.   Electronically Signed   By: Amie Portlandavid  Ormond M.D.   On: 02/21/2015 18:31   Labs: Basic Metabolic Panel:  Recent Labs Lab 02/21/15 1311 02/22/15 0850  NA 130* 132*  K 4.6 4.3  CL 92* 94*  CO2 31 30  GLUCOSE 100* 88  BUN 39* 35*  CREATININE 1.66* 1.51*  CALCIUM 8.7* 8.5*   CBC:  Recent Labs Lab 02/21/15 1311 02/22/15 0850  WBC 11.0* 10.3  NEUTROABS 7.4  --   HGB 10.7* 11.1*  HCT 31.8* 34.2*  MCV 96.1 96.3  PLT 323 335   Cardiac Enzymes:  Recent Labs Lab 02/22/15 0850  TROPONINI 0.03   BNP: BNP (last 3 results)  Recent Labs  02/21/15 1311  BNP 224.4*    Signed:  Vassie LollMadera, Onelia Cadmus  [facility] 02/22/2015, 2:09 PM

## 2015-02-25 ENCOUNTER — Telehealth: Payer: Self-pay | Admitting: Interventional Cardiology

## 2015-02-25 NOTE — Telephone Encounter (Signed)
Rose returned call. She states that she is not aware of any reason why O2 wouldn't be covered. Rose states that she is not sure why the daughter thinks that it wont be.

## 2015-02-25 NOTE — Telephone Encounter (Signed)
Spoke with daughter, Gabriel RungLucinda Hartman, and informed her of information provided by Dr. Eldridge DaceVaranasi. Daughter verbalized understanding and was in agreement with this plan.

## 2015-02-25 NOTE — Telephone Encounter (Signed)
There is no need for an echo.  SHe is not a candidate for invasive treatment.  She has been evaluated by cardiac surgery.  SHe can get an oxygen sat measurement at rest and with exertion with her PMD and they can arrange for oxygen if needed based on those results.  Dr. Zachery DauerBarnes also follows her blood pressure and I would think does routine lab work.

## 2015-02-25 NOTE — Telephone Encounter (Signed)
New Message    Pt daughter wants to understand and follow the Discharge Directions   And schedule a 2DEcho   [facility] [phone]  and the pt RN Oren Sectionose McKalski or Aram Beechamynthia informed pt daughter to call Dr. Isabel CapriceVaranassi

## 2015-02-25 NOTE — Telephone Encounter (Signed)
Spoke with daughter and she said she was advised by [facility] to contact our office in regards to pt getting an echo.  Recommendations for Outpatient Follow-up:  1. Repeat basic metabolic panel to follow electrolytes and renal function 2. Reassess blood pressure and adjust antihypertensive regimen as needed 3. Please arrange outpatient follow-up with patient's cardiologist to reassess patient our take a stenosis and repeat 2-D echo if she is due for it 4. Oxygen 2L especially on exertion to maintain O2 sat above 90% 5. Low sodium diet 6. Daily weights 7. Repeat CBC to follow hemoglobin trend.  Pt's last echo was 10/26/2013.  Daughter also stated that [facility] told her that there was no diagnosis listed on d/c papers that would warrant pt having oxygen so it would not be covered. Called and spoke with Aram [facility]Cynthia at [facility] and she stated that she would have Rose call me back. Will forward to Dr. Eldridge DaceVaranasi for review and advisement.

## 2015-02-28 ENCOUNTER — Encounter: Payer: Self-pay | Admitting: Internal Medicine

## 2015-02-28 ENCOUNTER — Non-Acute Institutional Stay (SKILLED_NURSING_FACILITY): Payer: Medicare Other | Admitting: Internal Medicine

## 2015-02-28 DIAGNOSIS — I951 Orthostatic hypotension: Secondary | ICD-10-CM

## 2015-02-28 DIAGNOSIS — N183 Chronic kidney disease, stage 3 unspecified: Secondary | ICD-10-CM

## 2015-02-28 DIAGNOSIS — F0390 Unspecified dementia without behavioral disturbance: Secondary | ICD-10-CM | POA: Diagnosis not present

## 2015-02-28 DIAGNOSIS — R2681 Unsteadiness on feet: Secondary | ICD-10-CM | POA: Diagnosis not present

## 2015-02-28 DIAGNOSIS — N189 Chronic kidney disease, unspecified: Secondary | ICD-10-CM | POA: Diagnosis not present

## 2015-02-28 DIAGNOSIS — N3281 Overactive bladder: Secondary | ICD-10-CM | POA: Diagnosis not present

## 2015-02-28 DIAGNOSIS — R609 Edema, unspecified: Secondary | ICD-10-CM | POA: Diagnosis not present

## 2015-02-28 DIAGNOSIS — D631 Anemia in chronic kidney disease: Secondary | ICD-10-CM

## 2015-02-28 DIAGNOSIS — J984 Other disorders of lung: Secondary | ICD-10-CM

## 2015-02-28 DIAGNOSIS — R0600 Dyspnea, unspecified: Secondary | ICD-10-CM | POA: Diagnosis not present

## 2015-02-28 NOTE — Progress Notes (Signed)
Patient ID: Sheri Hartman, female   DOB: 09-27-1918, 81 y.o.   MRN: [id number]    HISTORY AND PHYSICAL  Location:  [facility] Room Number: 66A Place of Service: SNF (31)   Extended Emergency Contact Information Primary Emergency Contact: Valvo,Melvin Address: [place] [address] [place] Phone: [phone] Relation: Spouse Secondary Emergency Contact: Frost,Lucinda Address: [address] [place] [address] [place] Phone: [phone] Mobile Phone: [phone] Relation: Daughter  Advanced Directive information Does patient have an advance directive?: Yes, Type of Advance Directive: Out of facility DNR (pink MOST or yellow form), Pre-existing out of facility DNR order (yellow form or pink MOST form): Yellow form placed in chart (order not valid for inpatient use)  Chief Complaint  Patient presents with  . New Admit To SNF    HPI:  This is a new admission to skilled nursing facility following hospitalization from 02/21/2015 through 02/22/2015. Patient has some dementia which seems to be in a more progress level than is evident from previous notes on this patient. She has been living with her husband in independent living section of [facility]. Her husband was hospitalized for 3 days last week with Clostridium difficile colitis. He has returned home, but in the meantime she was found confused and hypotensive and was taken to the emergency room where she was kept overnight. Lab work was fairly unremarkable except for the hypoxemia. She was placed on oxygen. Confusion level seems higher than was appreciated in the past. Patient appears to have a restrictive lung disease based on her kyphosis and poor chest excursion. X-ray suggests COPD as well. Patient was quite dyspneic on admission to hospital, but this seems to have cleared somewhat. She remains tachypneic.  Overall  debility and deconditioning is apparent. Patient is at high risk for falling and has an unstable gait. She compensates by using a four-wheel rolling walker with a seat, but remains unstable.  She also has an increase in pedal edema. Hyponatremia 230 was noted in the hospital.  Patient has CKD with a BUN of 35 and creatinine 1.51. An anemia which has been attributed to her CKD is also present. Last hemoglobin was 11.1 g percent.  Ration has a prior history of rheumatic fever and now has aortic stenosis.  Patient carries previous diagnoses of degenerative disc disease of the spine. An old compression fracture of the thoracic vertebra was noted on x-rays of her chest.  Dementia has progressed to the point that patient may not be safe to return to her apartment. We will see how things develop over her skilled nursing facility stay with strengthening.  Past Medical History  Diagnosis Date  . Hypertension   . History of rheumatic fever   . Degenerative arthritis   . Hyperlipidemia   . Stress incontinence   . Diverticulosis   . Chronic kidney disease     STABLE  . History of fractured pelvis   . DDD (degenerative disc disease), lumbar   . OAB (overactive bladder)   . SBE (subacute bacterial endocarditis) prophylaxis candidate   . Osteoporosis, senile   . Macular degeneration   . Aortic stenosis   . Anemia   . COPD (chronic obstructive pulmonary disease)   . CAD (coronary artery disease)   . Unstable gait   . Edema   . Dementia   .  Restrictive lung disease   . Compression fracture of thoracic vertebra   . Fracture, pelvis closed     Past Surgical History  Procedure Laterality Date  . Back surgery      lumbar disc surgery  . Tonsillectomy    . Eye surgery    . Arthroscopic knee surgery on the left      Patient Care Team: Leighton Ruff, MD as PCP - General (Family Medicine) Harle Battiest, MD as Attending Physician (Obstetrics and Gynecology) Jettie Booze, MD as  Attending Physician (Cardiology) Rexene Alberts, MD as Attending Physician (Cardiothoracic Surgery) Bennye Alm, NP as Nurse Practitioner (Cardiology) Tanda Rockers, MD as Attending Physician (Pulmonary Disease) [facility] Man Mast X, NP as Nurse Practitioner (Nurse Practitioner)  History   Social History  . Marital Status: Married    Spouse Name: N/A  . Number of Children: 3  . Years of Education: N/A   Occupational History  . Retired Optician, dispensing    Social History Main Topics  . Smoking status: Never Smoker   . Smokeless tobacco: Never Used  . Alcohol Use: Yes     Comment: 4 oz wine daily in the evening  . Drug Use: No  . Sexual Activity: Not on file   Other Topics Concern  . Not on file   Social History Narrative     reports that she has never smoked. She has never used smokeless tobacco. She reports that she drinks alcohol. She reports that she does not use illicit drugs.  Family History  Problem Relation Age of Onset  . Hypertension Mother   . Hypertension Father    Family Status  Relation Status Death Age  . Mother Deceased     "age"  . Father Deceased     "age"  . Brother Other     unknown  . Daughter Alive   . Son Alive   . Son Alive     Immunization History  Administered Date(s) Administered  . Influenza Whole 05/11/2011, 05/10/2012  . Td 08/10/1964    Allergies  Allergen Reactions  . Ciprofloxacin Other (See Comments)    dizziness and disorientation  . Tramadol Other (See Comments)    Disorientation, disconnected   . Latex Other (See Comments)    Unknown....years ago....eczema    Medications: Patient's Medications  New Prescriptions   No medications on file  Previous Medications   ASCORBIC ACID (VITAMIN C PO)    Take 1 tablet by mouth daily.    CHOLECALCIFEROL (VITAMIN D PO)    Take 1 tablet by mouth daily.    FUROSEMIDE (LASIX) 20 MG TABLET    Take 1 tablet (20 mg total) by mouth daily.   LOSARTAN (COZAAR) 100 MG  TABLET    Take 0.5 tablets (50 mg total) by mouth daily.   LOSARTAN-HYDROCHLOROTHIAZIDE (HYZAAR) 100-12.5 MG PER TABLET    Take 1 tablet by mouth daily.   MULTIPLE MINERALS (CALCIUM-MAGNESIUM-ZINC) TABS    Take 1 tablet by mouth daily.   MULTIPLE VITAMIN (MULTIVITAMIN) TABLET    Take 1 tablet by mouth daily.   MULTIPLE VITAMINS-MINERALS (ICAPS) CAPS    Take 1 capsule by mouth daily.   NITROGLYCERIN (NITRODUR - DOSED IN MG/24 HR) 0.2 MG/HR PATCH    APPLY 1 PATCH TO SKIN ONCE A DAY   OMEGA-3 FATTY ACIDS (OMEGA 3 PO)    Take 1 tablet by mouth daily.    PRAVASTATIN (PRAVACHOL) 20 MG TABLET    Take 1 tablet (  20 mg total) by mouth daily.   SODIUM CHLORIDE (OCEAN) 0.65 % SOLN NASAL SPRAY    Place 1 spray into both nostrils as needed for congestion.   VITAMIN E COMPLEX PO    Take 1 tablet by mouth daily.   Modified Medications   No medications on file  Discontinued Medications   No medications on file    Review of Systems  Constitutional: Positive for fatigue. Negative for fever, chills, diaphoresis, activity change, appetite change and unexpected weight change.  HENT: Negative for congestion, ear discharge, ear pain, hearing loss, postnasal drip, rhinorrhea, sore throat, tinnitus, trouble swallowing and voice change.   Eyes: Positive for visual disturbance (corrective lenses). Negative for pain, redness and itching.  Respiratory: Positive for shortness of breath. Negative for cough, choking and wheezing.        Oxygen dependent  Cardiovascular: Positive for leg swelling. Negative for chest pain and palpitations.  Gastrointestinal: Negative for nausea, abdominal pain, diarrhea, constipation and abdominal distention.  Endocrine: Negative for cold intolerance, heat intolerance, polydipsia, polyphagia and polyuria.  Genitourinary: Negative for dysuria, urgency, frequency, hematuria, flank pain, vaginal discharge, difficulty urinating and pelvic pain.  Musculoskeletal: Positive for gait problem.  Negative for myalgias, back pain, arthralgias, neck pain and neck stiffness.  Skin: Negative for color change, pallor and rash.  Allergic/Immunologic: Negative.   Neurological: Negative for dizziness, tremors, seizures, syncope, weakness, numbness and headaches.       Memory disorder  Hematological: Negative for adenopathy. Does not bruise/bleed easily.       Anemic  Psychiatric/Behavioral: Positive for confusion. Negative for suicidal ideas, hallucinations, behavioral problems, sleep disturbance, dysphoric mood and agitation. The patient is not nervous/anxious and is not hyperactive.     Filed Vitals:   02/28/15 1235  BP: 124/76  Pulse: 60  Temp: 98.6 F (37 C)  Resp: 20  Height: 4' 10" (1.473 m)  Weight: 111 lb (50.349 kg)   Body mass index is 23.21 kg/(m^2).  Physical Exam  Constitutional: She appears well-developed and well-nourished. No distress.  Frail  HENT:  Right Ear: External ear normal.  Left Ear: External ear normal.  Nose: Nose normal.  Mouth/Throat: Oropharynx is clear and moist. No oropharyngeal exudate.  Eyes: Conjunctivae and EOM are normal. Pupils are equal, round, and reactive to light. No scleral icterus.  Neck: No JVD present. No tracheal deviation present. No thyromegaly present.  Cardiovascular: Normal rate, regular rhythm, normal heart sounds and intact distal pulses.  Exam reveals no gallop and no friction rub.   No murmur heard. Pulmonary/Chest: No respiratory distress. She has no wheezes. She exhibits no tenderness.  Oxygen dependent. Tachypnea. Dry rales bilaterally.  Abdominal: She exhibits no distension and no mass. There is no tenderness.  Musculoskeletal: Normal range of motion. She exhibits edema. She exhibits no tenderness.  Unstable gait. Using walker.  Lymphadenopathy:    She has no cervical adenopathy.  Neurological: She is alert. No cranial nerve deficit. Coordination normal.  Unable to give her history. Does not recall being in the  hospital.  Skin: No rash noted. She is not diaphoretic. No erythema. No pallor.  Psychiatric: She has a normal mood and affect. Her behavior is normal. Thought content normal.     Labs reviewed: Admission on 02/21/2015, Discharged on 02/22/2015  Component Date Value Ref Range Status  . WBC 02/21/2015 11.0* 4.0 - 10.5 K/uL Final  . RBC 02/21/2015 3.31* 3.87 - 5.11 MIL/uL Final  . Hemoglobin 02/21/2015 10.7* 12.0 - 15.0 g/dL Final  .  HCT 02/21/2015 31.8* 36.0 - 46.0 % Final  . MCV 02/21/2015 96.1  78.0 - 100.0 fL Final  . MCH 02/21/2015 32.3  26.0 - 34.0 pg Final  . MCHC 02/21/2015 33.6  30.0 - 36.0 g/dL Final  . RDW 02/21/2015 13.9  11.5 - 15.5 % Final  . Platelets 02/21/2015 323  150 - 400 K/uL Final  . Neutrophils Relative % 02/21/2015 67  43 - 77 % Final  . Lymphocytes Relative 02/21/2015 17  12 - 46 % Final  . Monocytes Relative 02/21/2015 14* 3 - 12 % Final  . Eosinophils Relative 02/21/2015 2  0 - 5 % Final  . Basophils Relative 02/21/2015 0  0 - 1 % Final  . Neutro Abs 02/21/2015 7.4  1.7 - 7.7 K/uL Final  . Lymphs Abs 02/21/2015 1.9  0.7 - 4.0 K/uL Final  . Monocytes Absolute 02/21/2015 1.5* 0.1 - 1.0 K/uL Final  . Eosinophils Absolute 02/21/2015 0.2  0.0 - 0.7 K/uL Final  . Basophils Absolute 02/21/2015 0.0  0.0 - 0.1 K/uL Final  . Smear Review 02/21/2015 MORPHOLOGY UNREMARKABLE   Final  . Sodium 02/21/2015 130* 135 - 145 mmol/L Final  . Potassium 02/21/2015 4.6  3.5 - 5.1 mmol/L Final  . Chloride 02/21/2015 92* 101 - 111 mmol/L Final  . CO2 02/21/2015 31  22 - 32 mmol/L Final  . Glucose, Bld 02/21/2015 100* 65 - 99 mg/dL Final  . BUN 02/21/2015 39* 6 - 20 mg/dL Final  . Creatinine, Ser 02/21/2015 1.66* 0.44 - 1.00 mg/dL Final  . Calcium 02/21/2015 8.7* 8.9 - 10.3 mg/dL Final  . GFR calc non Af Amer 02/21/2015 25* >60 mL/min Final  . GFR calc Af Amer 02/21/2015 29* >60 mL/min Final   Comment: (NOTE) The eGFR has been calculated using the CKD EPI equation. This  calculation has not been validated in all clinical situations. eGFR's persistently <60 mL/min signify possible Chronic Kidney Disease.   . Anion gap 02/21/2015 7  5 - 15 Final  . Troponin i, poc 02/21/2015 0.00  0.00 - 0.08 ng/mL Final  . Comment 3 02/21/2015          Final   Comment: Due to the release kinetics of cTnI, a negative result within the first hours of the onset of symptoms does not rule out myocardial infarction with certainty. If myocardial infarction is still suspected, repeat the test at appropriate intervals.   . B Natriuretic Peptide 02/21/2015 224.4* 0.0 - 100.0 pg/mL Final  . Troponin I 02/22/2015 0.03  <0.031 ng/mL Final   Comment:        NO INDICATION OF MYOCARDIAL INJURY.   . Sodium 02/22/2015 132* 135 - 145 mmol/L Final  . Potassium 02/22/2015 4.3  3.5 - 5.1 mmol/L Final   SPECIMEN HEMOLYZED. HEMOLYSIS MAY AFFECT INTEGRITY OF RESULTS.  Marland Kitchen Chloride 02/22/2015 94* 101 - 111 mmol/L Final  . CO2 02/22/2015 30  22 - 32 mmol/L Final  . Glucose, Bld 02/22/2015 88  65 - 99 mg/dL Final  . BUN 02/22/2015 35* 6 - 20 mg/dL Final  . Creatinine, Ser 02/22/2015 1.51* 0.44 - 1.00 mg/dL Final  . Calcium 02/22/2015 8.5* 8.9 - 10.3 mg/dL Final  . GFR calc non Af Amer 02/22/2015 28* >60 mL/min Final  . GFR calc Af Amer 02/22/2015 33* >60 mL/min Final   Comment: (NOTE) The eGFR has been calculated using the CKD EPI equation. This calculation has not been validated in all clinical situations. eGFR's persistently <60 mL/min  signify possible Chronic Kidney Disease.   . Anion gap 02/22/2015 8  5 - 15 Final  . WBC 02/22/2015 10.3  4.0 - 10.5 K/uL Final  . RBC 02/22/2015 3.55* 3.87 - 5.11 MIL/uL Final  . Hemoglobin 02/22/2015 11.1* 12.0 - 15.0 g/dL Final  . HCT 02/22/2015 34.2* 36.0 - 46.0 % Final  . MCV 02/22/2015 96.3  78.0 - 100.0 fL Final  . MCH 02/22/2015 31.3  26.0 - 34.0 pg Final  . MCHC 02/22/2015 32.5  30.0 - 36.0 g/dL Final  . RDW 02/22/2015 13.9  11.5 - 15.5 %  Final  . Platelets 02/22/2015 335  150 - 400 K/uL Final    Dg Chest 2 View  02/21/2015   CLINICAL DATA:  Dyspnea.  EXAM: CHEST  2 VIEW  COMPARISON:  February 02, 2013.  FINDINGS: The heart size and mediastinal contours are within normal limits. Both lungs are clear. No pneumothorax or pleural effusion is noted. Stable compression deformity of upper thoracic vertebral body is noted consistent with old fracture.  IMPRESSION: No active cardiopulmonary disease.   Electronically Signed   By: Marijo Conception, M.D.   On: 02/21/2015 15:22   Nm Pulmonary Perf And Vent  02/21/2015   CLINICAL DATA:  Exertional dyspnea. History of aortic stenosis and hypertension as well as chronic kidney disease.  EXAM: NUCLEAR MEDICINE VENTILATION - PERFUSION LUNG SCAN  TECHNIQUE: Ventilation images were obtained in multiple projections using inhaled aerosol Tc-9mDTPA. Perfusion images were obtained in multiple projections after intravenous injection of Tc-914mAA.  RADIOPHARMACEUTICALS:  40 mCi of Technetium-996mPA aerosol inhalation and 6.0 mCi of Technetium-54m32m IV  COMPARISON:  Current chest radiograph  FINDINGS: Ventilation: There is heterogeneous perfusion to both lungs with multiple irregular areas of decreased ventilation.  Perfusion: There are patchy areas of decreased profusion which appear nonsegmental, greater on the left. These areas correlate with the areas of decreased ventilation, but are less extensive.  IMPRESSION: 1. Low probability study for pulmonary thromboembolism. 2. Multiple nonsegmental areas of relative decreased profusion, which show similar but more extensive areas of decreased ventilation on the ventilation portion of the exam. Findings support small airways disease.   Electronically Signed   By: DaviLajean Manes.   On: 02/21/2015 18:31     Assessment/Plan  1. Unstable gait Continue physical therapy  2. Edema Observe  3. Dementia, without behavioral disturbance Do MMSE  4. Restrictive  lung disease Observe on oxygen  5. Anemia in chronic kidney disease Follow-up reticulocyte count  6. Chronic kidney disease, stage 3 (moderate) Follow lab  7. Orthostatic hypotension Resolved  8. OAB (overactive bladder) Patient denies incontinence  9. Dyspnea Improved since she was hospitalized, but she does remain mildly tachypneic.  10. Hyperlipidemia Follow-up lipid panel

## 2015-03-06 ENCOUNTER — Non-Acute Institutional Stay (SKILLED_NURSING_FACILITY): Payer: Medicare Other | Admitting: Adult Health

## 2015-03-06 DIAGNOSIS — J439 Emphysema, unspecified: Secondary | ICD-10-CM

## 2015-03-07 ENCOUNTER — Encounter: Payer: Self-pay | Admitting: Internal Medicine

## 2015-03-13 ENCOUNTER — Non-Acute Institutional Stay (SKILLED_NURSING_FACILITY): Payer: Medicare Other | Admitting: Adult Health

## 2015-03-13 DIAGNOSIS — F418 Other specified anxiety disorders: Secondary | ICD-10-CM | POA: Diagnosis not present

## 2015-03-13 DIAGNOSIS — K222 Esophageal obstruction: Secondary | ICD-10-CM | POA: Diagnosis not present

## 2015-03-15 ENCOUNTER — Encounter: Payer: Self-pay | Admitting: Internal Medicine

## 2015-03-21 ENCOUNTER — Encounter: Payer: Self-pay | Admitting: Adult Health

## 2015-03-21 DIAGNOSIS — J439 Emphysema, unspecified: Secondary | ICD-10-CM | POA: Insufficient documentation

## 2015-03-21 MED ORDER — TIOTROPIUM BROMIDE MONOHYDRATE 1.25 MCG/ACT IN AERS: 2.0000 | INHALATION_SPRAY | Freq: Every day | RESPIRATORY_TRACT | Status: AC

## 2015-03-21 MED ORDER — IPRATROPIUM-ALBUTEROL 0.5-2.5 (3) MG/3ML IN SOLN: 3.0000 mL | Freq: Four times a day (QID) | RESPIRATORY_TRACT | Status: AC | PRN

## 2015-03-21 NOTE — Progress Notes (Signed)
Patient ID: Sheri Hartman, female   DOB: 11-08-1918, 81 y.o.   MRN: [id number]    Facility: [facility]       Allergies  Allergen Reactions  . Ciprofloxacin Other (See Comments)    dizziness and disorientation  . Tramadol Other (See Comments)    Disorientation, disconnected   . Latex Other (See Comments)    Unknown....years ago....eczema    Chief Complaint  Patient presents with  . Acute Visit    patient status     HPI:  Staff is concerned that she appears to be more confused with increased shaking present. She is short of breath today; states that she has a cough and is short of breath. She is angry about being skilled nursing. There are no reports of fever present; there are no reports of changes in her appetite.    Past Medical History  Diagnosis Date  . Hypertension   . History of rheumatic fever   . Degenerative arthritis   . Hyperlipidemia   . Stress incontinence   . Diverticulosis   . Chronic kidney disease     STABLE  . History of fractured pelvis   . DDD (degenerative disc disease), lumbar   . OAB (overactive bladder)   . SBE (subacute bacterial endocarditis) prophylaxis candidate   . Osteoporosis, senile   . Macular degeneration   . Aortic stenosis   . Anemia   . COPD (chronic obstructive pulmonary disease)   . CAD (coronary artery disease)   . Unstable gait   . Edema   . Dementia   . Restrictive lung disease   . Compression fracture of thoracic vertebra   . Fracture, pelvis closed     Past Surgical History  Procedure Laterality Date  . Back surgery      lumbar disc surgery  . Tonsillectomy    . Eye surgery    . Arthroscopic knee surgery on the left      VITAL SIGNS BP 120/70 mmHg  Pulse 76  Resp 22  Ht 4\' 10"  (1.473 m)  Wt 110 lb 14.4 oz (50.304 kg)  BMI 23.18 kg/m2  Patient's Medications  New Prescriptions   No medications on file  Previous Medications   DONEPEZIL (ARICEPT) 5 MG TABLET    Take 5 mg by mouth at  bedtime.   FUROSEMIDE (LASIX) 20 MG TABLET    Take 1 tablet (20 mg total) by mouth daily.   LORAZEPAM (ATIVAN) 0.5 MG TABLET    Take 0.5 mg by mouth every 8 (eight) hours as needed for anxiety.   LOSARTAN (COZAAR) 100 MG TABLET    Take 0.5 tablets (50 mg total) by mouth daily.   MULTIPLE VITAMINS-MINERALS (ICAPS) CAPS    Take 1 capsule by mouth daily.   NITROGLYCERIN (NITROSTAT) 0.4 MG SL TABLET    Place 0.4 mg under the tongue every 5 (five) minutes as needed for chest pain.   PRAVASTATIN (PRAVACHOL) 20 MG TABLET    Take 1 tablet (20 mg total) by mouth daily.   SODIUM CHLORIDE (OCEAN) 0.65 % SOLN NASAL SPRAY    Place 1 spray into both nostrils as needed for congestion.  Modified Medications   No medications on file  Discontinued Medications     SIGNIFICANT DIAGNOSTIC EXAMS    Review of Systems  Constitutional: Negative for appetite change and fatigue.  HENT: Negative for congestion.   Respiratory: Positive for cough and shortness of breath. Negative for chest tightness.   Cardiovascular: Positive for  leg swelling. Negative for chest pain and palpitations.  Gastrointestinal: Negative for nausea, abdominal pain, diarrhea and constipation.  Musculoskeletal: Negative for myalgias and arthralgias.  Skin: Negative for pallor.  Psychiatric/Behavioral: The patient is not nervous/anxious.       Physical Exam  Constitutional: No distress.  Thin   Eyes: Conjunctivae are normal.  Neck: Neck supple. No JVD present. No thyromegaly present.  Cardiovascular: Normal rate, regular rhythm and intact distal pulses.   Respiratory: Effort normal. No respiratory distress. She has wheezes.  Is 02 dependent   GI: Soft. Bowel sounds are normal. She exhibits no distension. There is no tenderness.  Musculoskeletal: She exhibits no edema.  Able to move all extremities   Lymphadenopathy:    She has no cervical adenopathy.  Neurological: She is alert.  Skin: Skin is warm and dry. She is not  diaphoretic.  Psychiatric: She has a normal mood and affect.     ASSESSMENT/ PLAN:  1. COPD: will begin duoneb every 6 hours for one week then every 6 hours as needed; will begin spiriva respimet 2 puffs daily will monitor      Synthia Innocent NP [facility]  Contact [phone] Monday through Friday 8am- 5pm  After hours call [phone]

## 2015-03-24 DIAGNOSIS — K222 Esophageal obstruction: Secondary | ICD-10-CM | POA: Insufficient documentation

## 2015-03-24 DIAGNOSIS — F418 Other specified anxiety disorders: Secondary | ICD-10-CM | POA: Insufficient documentation

## 2015-03-24 NOTE — Progress Notes (Signed)
Patient ID: Sheri Hartman, female   DOB: 03-30-1919, 81 y.o.   MRN: [id number]   Facility: [facility]        Allergies  Allergen Reactions  . Ciprofloxacin Other (See Comments)    dizziness and disorientation  . Tramadol Other (See Comments)    Disorientation, disconnected   . Latex Other (See Comments)    Unknown....years ago....eczema    Chief Complaint  Patient presents with  . Acute Visit    staff concerns     HPI:  She has reported to staff that she is having difficulty swallowing and feels as though food is getting stuck in her throat. The is also concerned that she is having issues with depression and anxiety as she continues be upset and angry most of the time.    Past Medical History  Diagnosis Date  . Hypertension   . History of rheumatic fever   . Degenerative arthritis   . Hyperlipidemia   . Stress incontinence   . Diverticulosis   . Chronic kidney disease     STABLE  . History of fractured pelvis   . DDD (degenerative disc disease), lumbar   . OAB (overactive bladder)   . SBE (subacute bacterial endocarditis) prophylaxis candidate   . Osteoporosis, senile   . Macular degeneration   . Aortic stenosis   . Anemia   . COPD (chronic obstructive pulmonary disease)   . CAD (coronary artery disease)   . Unstable gait   . Edema   . Dementia   . Restrictive lung disease   . Compression fracture of thoracic vertebra   . Fracture, pelvis closed     Past Surgical History  Procedure Laterality Date  . Back surgery      lumbar disc surgery  . Tonsillectomy    . Eye surgery    . Arthroscopic knee surgery on the left      VITAL SIGNS BP 122/78 mmHg  Pulse 69  Ht  (1.473 m)  Wt 111 lb (50.349 kg)  BMI 23.21 kg/m2  Patient's Medications  New Prescriptions   No medications on file  Previous Medications   DONEPEZIL (ARICEPT) 5 MG TABLET    Take 5 mg by mouth at bedtime.   FUROSEMIDE (LASIX) 20 MG TABLET    Take 1 tablet (20 mg  total) by mouth daily.   IPRATROPIUM-ALBUTEROL (DUONEB) 0.5-2.5 (3) MG/3ML SOLN    Take 3 mLs by nebulization every 6 (six) hours as needed.   LORAZEPAM (ATIVAN) 0.5 MG TABLET    Take 0.5 mg by mouth every 8 (eight) hours as needed for anxiety.   LOSARTAN (COZAAR) 100 MG TABLET    Take 0.5 tablets (50 mg total) by mouth daily.   MULTIPLE VITAMINS-MINERALS (ICAPS) CAPS    Take 1 capsule by mouth daily.   NITROGLYCERIN (NITROSTAT) 0.4 MG SL TABLET    Place 0.4 mg under the tongue every 5 (five) minutes as needed for chest pain.   PRAVASTATIN (PRAVACHOL) 20 MG TABLET    Take 1 tablet (20 mg total) by mouth daily.   SODIUM CHLORIDE (OCEAN) 0.65 % SOLN NASAL SPRAY    Place 1 spray into both nostrils as needed for congestion.   TIOTROPIUM BROMIDE MONOHYDRATE (SPIRIVA RESPIMAT) 1.25 MCG/ACT AERS    Inhale 2 puffs into the lungs daily.  Modified Medications   No medications on file  Discontinued Medications   No medications on file     SIGNIFICANT DIAGNOSTIC EXAMS   LABS  REVIEWED:   03-05-15: wbc 8.5 hgb 10.2; hct 31.1; mcv 85.4; plt 315; glucose 92; bun 50; creat 1.56; k+ 4.8; na++139; liver normal albumin 3.6; chol 123 ldl 48; trig 45; hdl 66      Review of Systems  Constitutional: Negative for appetite change and fatigue.  HENT: Negative for congestion.   Respiratory: Negative for cough, chest tightness and shortness of breath.   Cardiovascular: Negative for chest pain, palpitations and leg swelling.  Gastrointestinal: Negative for nausea, abdominal pain, diarrhea and constipation.       States " I'm  Fine"  Musculoskeletal: Negative for myalgias and arthralgias.  Skin: Negative for pallor.  Neurological: Negative for dizziness.  Psychiatric/Behavioral: The patient is nervous/anxious.       Physical Exam  Constitutional: No distress.  Thin   Eyes: Conjunctivae are normal.  Neck: Neck supple. No JVD present. No thyromegaly present.  Cardiovascular: Normal rate, regular rhythm  and intact distal pulses.   Respiratory: Effort normal and breath sounds normal. No respiratory distress. She has no wheezes.  Is 02 dependent   GI: Soft. Bowel sounds are normal. She exhibits no distension. There is no tenderness.  Musculoskeletal: She exhibits no edema.  Able to move all extremities   Lymphadenopathy:    She has no cervical adenopathy.  Neurological: She is alert.  Skin: Skin is warm and dry. She is not diaphoretic.  Psychiatric: She has a normal mood and affect.     ASSESSMENT/ PLAN:  1. Question esophageal stricture: will setup a GI consult and will continue to monitor her status.   2. Depression with anxiety: will being zoloft 25 mg daily and will monitor her status.   Synthia Innocent NP [facility]  Contact [phone] Monday through Friday 8am- 5pm  After hours call [phone]

## 2015-04-03 ENCOUNTER — Non-Acute Institutional Stay (SKILLED_NURSING_FACILITY): Payer: Medicare Other | Admitting: Nurse Practitioner

## 2015-04-03 DIAGNOSIS — R2681 Unsteadiness on feet: Secondary | ICD-10-CM

## 2015-04-03 DIAGNOSIS — I10 Essential (primary) hypertension: Secondary | ICD-10-CM | POA: Diagnosis not present

## 2015-04-03 DIAGNOSIS — F0390 Unspecified dementia without behavioral disturbance: Secondary | ICD-10-CM | POA: Diagnosis not present

## 2015-04-03 DIAGNOSIS — F418 Other specified anxiety disorders: Secondary | ICD-10-CM | POA: Diagnosis not present

## 2015-04-03 DIAGNOSIS — K222 Esophageal obstruction: Secondary | ICD-10-CM

## 2015-04-03 DIAGNOSIS — J439 Emphysema, unspecified: Secondary | ICD-10-CM

## 2015-04-03 DIAGNOSIS — R609 Edema, unspecified: Secondary | ICD-10-CM

## 2015-04-04 ENCOUNTER — Encounter: Payer: Self-pay | Admitting: Nurse Practitioner

## 2015-04-04 NOTE — Progress Notes (Signed)
Patient ID: Sheri Hartman, female   DOB: 1919/03/01, 81 y.o.   MRN: [id number]  Location:  SNF [facility] Provider:  Chipper Oman NP  Code Status:  DNR Goals of care: Advanced Directive information    Chief Complaint  Patient presents with  . Medical Management of Chronic Issues     HPI: Patient is a 81 y.o. female seen in the SNF at [facility] today for evaluation of chronic medical conditions. Hx of dementia, takes Aricept 5mg  daily, forgetful, repeatedly asking for her room on SNF, depression/anxiety showed positive responses since Sertraline 25mg  along with prn Lorazepam, blood pressure has been controlled and her chronic dependent edema has no change.   Review of Systems:  Review of Systems  Constitutional: Negative for fever, chills and diaphoresis.  HENT: Negative for congestion, ear discharge, ear pain, hearing loss, sore throat and tinnitus.   Eyes: Negative for pain and redness.  Respiratory: Positive for shortness of breath. Negative for cough and wheezing.        Oxygen dependent  Cardiovascular: Positive for leg swelling. Negative for chest pain and palpitations.  Gastrointestinal: Negative for nausea, abdominal pain, diarrhea and constipation.  Genitourinary: Negative for dysuria, urgency, frequency, hematuria and flank pain.  Musculoskeletal: Negative for myalgias, back pain and neck pain.  Skin: Negative for rash.  Neurological: Negative for dizziness, tremors, seizures, weakness and headaches.       Memory disorder  Endo/Heme/Allergies: Negative for polydipsia. Does not bruise/bleed easily.       Anemic  Psychiatric/Behavioral: Negative for suicidal ideas and hallucinations. The patient is not nervous/anxious.     Past Medical History  Diagnosis Date  . Hypertension   . History of rheumatic fever   . Degenerative arthritis   . Hyperlipidemia   . Stress incontinence   . Diverticulosis   . Chronic kidney disease     STABLE  . History of fractured pelvis    . DDD (degenerative disc disease), lumbar   . OAB (overactive bladder)   . SBE (subacute bacterial endocarditis) prophylaxis candidate   . Osteoporosis, senile   . Macular degeneration   . Aortic stenosis   . Anemia   . COPD (chronic obstructive pulmonary disease)   . CAD (coronary artery disease)   . Unstable gait   . Edema   . Dementia   . Restrictive lung disease   . Compression fracture of thoracic vertebra   . Fracture, pelvis closed     Patient Active Problem List   Diagnosis Date Noted  . Esophageal stricture 03/24/2015  . Depression with anxiety 03/24/2015  . COPD (chronic obstructive pulmonary disease) with emphysema 03/21/2015  . Unstable gait   . Edema   . Dementia   . Restrictive lung disease   . Hypoxia 02/21/2015  . Hypotension 02/21/2015  . Monocytosis 03/08/2014  . Chronic kidney disease 03/08/2014  . Aortic stenosis   . Hypertension   . History of rheumatic fever   . Degenerative arthritis   . Hyperlipidemia   . Stress incontinence   . Diverticulosis   . DDD (degenerative disc disease), cervical   . OAB (overactive bladder)   . SBE (subacute bacterial endocarditis) prophylaxis candidate   . Anemia in chronic kidney disease 08/29/2011  . Dyspnea 08/28/2011  . Osteoporosis 08/28/2011    Allergies  Allergen Reactions  . Ciprofloxacin Other (See Comments)    dizziness and disorientation  . Tramadol Other (See Comments)    Disorientation, disconnected   . Latex Other (See  Comments)    Unknown....years ago....eczema    Medications: Patient's Medications  New Prescriptions   No medications on file  Previous Medications   DONEPEZIL (ARICEPT) 5 MG TABLET    Take 5 mg by mouth at bedtime.   FUROSEMIDE (LASIX) 20 MG TABLET    Take 1 tablet (20 mg total) by mouth daily.   IPRATROPIUM-ALBUTEROL (DUONEB) 0.5-2.5 (3) MG/3ML SOLN    Take 3 mLs by nebulization every 6 (six) hours as needed.   LORAZEPAM (ATIVAN) 0.5 MG TABLET    Take 0.5 mg by mouth  every 8 (eight) hours as needed for anxiety.   LOSARTAN (COZAAR) 100 MG TABLET    Take 0.5 tablets (50 mg total) by mouth daily.   MULTIPLE VITAMINS-MINERALS (ICAPS) CAPS    Take 1 capsule by mouth daily.   NITROGLYCERIN (NITROSTAT) 0.4 MG SL TABLET    Place 0.4 mg under the tongue every 5 (five) minutes as needed for chest pain.   PRAVASTATIN (PRAVACHOL) 20 MG TABLET    Take 1 tablet (20 mg total) by mouth daily.   SODIUM CHLORIDE (OCEAN) 0.65 % SOLN NASAL SPRAY    Place 1 spray into both nostrils as needed for congestion.   TIOTROPIUM BROMIDE MONOHYDRATE (SPIRIVA RESPIMAT) 1.25 MCG/ACT AERS    Inhale 2 puffs into the lungs daily.  Modified Medications   No medications on file  Discontinued Medications   No medications on file    Physical Exam: Filed Vitals:   04/03/15 0949  BP: 116/68  Pulse: 92  Temp: 99.2 F (37.3 C)  TempSrc: Tympanic  Resp: 18   There is no weight on file to calculate BMI.  Physical Exam  Constitutional: She appears well-developed and well-nourished. No distress.  Frail  HENT:  Right Ear: External ear normal.  Left Ear: External ear normal.  Nose: Nose normal.  Mouth/Throat: Oropharynx is clear and moist. No oropharyngeal exudate.  Eyes: Conjunctivae and EOM are normal. Pupils are equal, round, and reactive to light. No scleral icterus.  Neck: No JVD present. No tracheal deviation present. No thyromegaly present.  Cardiovascular: Normal rate, regular rhythm, normal heart sounds and intact distal pulses.  Exam reveals no gallop and no friction rub.   No murmur heard. Pulmonary/Chest: No respiratory distress. She has no wheezes. She exhibits no tenderness.  Oxygen dependent. Tachypnea. Dry rales bilaterally.  Abdominal: She exhibits no distension and no mass. There is no tenderness.  Musculoskeletal: Normal range of motion. She exhibits edema. She exhibits no tenderness.  Unstable gait. Using walker. BLE 1+ edema  Lymphadenopathy:    She has no cervical  adenopathy.  Neurological: She is alert. No cranial nerve deficit. Coordination normal.  Unable to give her history. Does not recall being in the hospital.  Skin: No rash noted. She is not diaphoretic. No erythema. No pallor.  Psychiatric: She has a normal mood and affect. Her behavior is normal. Thought content normal.  forgetful    Labs reviewed: Basic Metabolic Panel:  Recent Labs  16/10/96 1311 02/22/15 0850  NA 130* 132*  K 4.6 4.3  CL 92* 94*  CO2 31 30  GLUCOSE 100* 88  BUN 39* 35*  CREATININE 1.66* 1.51*  CALCIUM 8.7* 8.5*    Liver Function Tests: No results for input(s): AST, ALT, ALKPHOS, BILITOT, PROT, ALBUMIN in the last 8760 hours.  CBC:  Recent Labs  02/21/15 1311 02/22/15 0850  WBC 11.0* 10.3  NEUTROABS 7.4  --   HGB 10.7* 11.1*  HCT 31.8*  34.2*  MCV 96.1 96.3  PLT 323 335    Lab Results  Component Value Date   TSH 1.30 08/28/2011   No results found for: HGBA1C No results found for: CHOL, HDL, LDLCALC, LDLDIRECT, TRIG, CHOLHDL  Significant Diagnostic Results since last visit: none  Patient Care Team: Juluis Rainier, MD as PCP - General (Family Medicine) Donovan Kail, MD as Attending Physician (Obstetrics and Gynecology) Corky Crafts, MD as Attending Physician (Cardiology) Purcell Nails, MD as Attending Physician (Cardiothoracic Surgery) Cristopher Peru, NP as Nurse Practitioner (Cardiology) Nyoka Cowden, MD as Attending Physician (Pulmonary Disease) [facility] Candia Kingsbury X, NP as Nurse Practitioner (Nurse Practitioner)  Assessment/Plan Problem List Items Addressed This Visit    Hypertension - Primary    Controlled, continue Losartan and Furosemide.       Unstable gait    Continue w/c for mobility       Edema    Chronic BLE 1+ edema, continue Furosemide.       Dementia    Forgetful, continue Aricept  daily and SNF for care needs      COPD (chronic obstructive pulmonary disease) with emphysema    Saw  Pulmonology in the past, continue Spiriva and DuoNeb, stable.       Esophageal stricture    Still able to swallow w/o apparent difficulty.       Depression with anxiety    Positive responses since Sertraline , will continue to observe the patient.           Family/ staff Communication: none  Labs/tests ordered:  none  ManXie Dayon Witt NP Geriatrics [facility] [address], [place] [address] On Call:  [phone] & follow prompts after 5pm & weekends Office Phone:  [phone] Office Fax:  [phone]

## 2015-04-04 NOTE — Assessment & Plan Note (Signed)
Controlled, continue Losartan and Furosemide.  

## 2015-04-04 NOTE — Assessment & Plan Note (Signed)
Continue w/c for mobility.  

## 2015-04-04 NOTE — Assessment & Plan Note (Signed)
Saw Pulmonology in the past, continue Spiriva and DuoNeb, stable.  

## 2015-04-04 NOTE — Assessment & Plan Note (Signed)
Forgetful, continue Aricept  daily and SNF for care needs

## 2015-04-04 NOTE — Assessment & Plan Note (Signed)
Positive responses since Sertraline , will continue to observe the patient.

## 2015-04-04 NOTE — Assessment & Plan Note (Signed)
Chronic BLE 1+ edema, continue Furosemide.

## 2015-04-04 NOTE — Assessment & Plan Note (Signed)
Still able to swallow w/o apparent difficulty.  

## 2015-04-06 NOTE — Progress Notes (Signed)
Resident of [facility] where she currently lives with her hsuband.  Patient has had an overnight stay with d/c order today.  Daughter Glee Arvin feels increased level of care to SNF is indicated; [facility] and [facility] agree and will accept patient to SNF Fl2 completed and MD signed. EMS to be arranged for d/c.  Nursing notified to call report to the facility. CSW will sign off.  Lorri Frederick. Jaci Laz[place], [place] [phone]

## 2015-04-12 LAB — BASIC METABOLIC PANEL
BUN: 34 mg/dL — AB (ref 4–21)
CREATININE: 1.8 mg/dL — AB (ref 0.5–1.1)
Glucose: 95 mg/dL
POTASSIUM: 4.4 mmol/L (ref 3.4–5.3)
SODIUM: 1 mmol/L — AB (ref 137–147)
SODIUM: 144 mmol/L (ref 137–147)

## 2015-04-16 ENCOUNTER — Other Ambulatory Visit: Payer: Self-pay | Admitting: Nurse Practitioner

## 2015-04-16 DIAGNOSIS — N183 Chronic kidney disease, stage 3 unspecified: Secondary | ICD-10-CM

## 2015-04-16 DIAGNOSIS — R609 Edema, unspecified: Secondary | ICD-10-CM

## 2015-04-22 ENCOUNTER — Ambulatory Visit: Payer: Medicare Other | Admitting: Interventional Cardiology

## 2015-05-03 ENCOUNTER — Emergency Department (HOSPITAL_COMMUNITY): Payer: Medicare Other

## 2015-05-03 ENCOUNTER — Emergency Department (HOSPITAL_COMMUNITY)
Admission: EM | Admit: 2015-05-03 | Discharge: 2015-05-04 | Disposition: A | Discharge status: Home / Self Care | Payer: Medicare Other | Attending: Emergency Medicine | Admitting: Emergency Medicine

## 2015-05-03 ENCOUNTER — Encounter (HOSPITAL_COMMUNITY): Payer: Self-pay | Admitting: *Deleted

## 2015-05-03 DIAGNOSIS — R011 Cardiac murmur, unspecified: Secondary | ICD-10-CM | POA: Insufficient documentation

## 2015-05-03 DIAGNOSIS — J449 Chronic obstructive pulmonary disease, unspecified: Secondary | ICD-10-CM | POA: Insufficient documentation

## 2015-05-03 DIAGNOSIS — Z91040 Latex allergy status: Secondary | ICD-10-CM | POA: Insufficient documentation

## 2015-05-03 DIAGNOSIS — Y9389 Activity, other specified: Secondary | ICD-10-CM | POA: Insufficient documentation

## 2015-05-03 DIAGNOSIS — M545 Low back pain: Secondary | ICD-10-CM

## 2015-05-03 DIAGNOSIS — I129 Hypertensive chronic kidney disease with stage 1 through stage 4 chronic kidney disease, or unspecified chronic kidney disease: Secondary | ICD-10-CM | POA: Diagnosis not present

## 2015-05-03 DIAGNOSIS — N189 Chronic kidney disease, unspecified: Secondary | ICD-10-CM | POA: Diagnosis not present

## 2015-05-03 DIAGNOSIS — I2510 Atherosclerotic heart disease of native coronary artery without angina pectoris: Secondary | ICD-10-CM | POA: Diagnosis not present

## 2015-05-03 DIAGNOSIS — W01198A Fall on same level from slipping, tripping and stumbling with subsequent striking against other object, initial encounter: Secondary | ICD-10-CM | POA: Diagnosis not present

## 2015-05-03 DIAGNOSIS — Z862 Personal history of diseases of the blood and blood-forming organs and certain disorders involving the immune mechanism: Secondary | ICD-10-CM | POA: Diagnosis not present

## 2015-05-03 DIAGNOSIS — Y998 Other external cause status: Secondary | ICD-10-CM | POA: Diagnosis not present

## 2015-05-03 DIAGNOSIS — F0390 Unspecified dementia without behavioral disturbance: Secondary | ICD-10-CM | POA: Insufficient documentation

## 2015-05-03 DIAGNOSIS — S3992XA Unspecified injury of lower back, initial encounter: Secondary | ICD-10-CM | POA: Diagnosis present

## 2015-05-03 DIAGNOSIS — E785 Hyperlipidemia, unspecified: Secondary | ICD-10-CM | POA: Diagnosis not present

## 2015-05-03 DIAGNOSIS — Y92198 Other place in other specified residential institution as the place of occurrence of the external cause: Secondary | ICD-10-CM | POA: Diagnosis not present

## 2015-05-03 DIAGNOSIS — M1990 Unspecified osteoarthritis, unspecified site: Secondary | ICD-10-CM | POA: Insufficient documentation

## 2015-05-03 DIAGNOSIS — Z79899 Other long term (current) drug therapy: Secondary | ICD-10-CM | POA: Diagnosis not present

## 2015-05-03 DIAGNOSIS — M542 Cervicalgia: Secondary | ICD-10-CM

## 2015-05-03 DIAGNOSIS — S199XXA Unspecified injury of neck, initial encounter: Secondary | ICD-10-CM | POA: Diagnosis not present

## 2015-05-03 DIAGNOSIS — W19XXXA Unspecified fall, initial encounter: Secondary | ICD-10-CM

## 2015-05-03 MED ORDER — ACETAMINOPHEN 325 MG PO TABS: 325.0000 mg | ORAL_TABLET | Freq: Four times a day (QID) | ORAL | Status: AC | PRN

## 2015-05-03 MED ORDER — ACETAMINOPHEN 325 MG PO TABS
325.0000 mg | ORAL_TABLET | Freq: Once | ORAL | Status: AC
  Administered 2015-05-03: 325 mg via ORAL
  Filled 2015-05-03: qty 1

## 2015-05-03 NOTE — ED Notes (Signed)
Pt resides at [facility],  She tripped over her oxygen tubing,  She is very poor historian,  She has dementia,  She stated when she first fell, no pain, as time has passed within the hour she has pain all over

## 2015-05-03 NOTE — ED Provider Notes (Signed)
CSN: [id number]     Arrival date & time 05/03/15  1952 History   First MD Initiated Contact with Patient 05/03/15 2006     Chief Complaint  Patient presents with  . Fall  . Back Pain  . Knee Pain     (Consider location/radiation/quality/duration/timing/severity/associated sxs/prior Treatment) HPI   Sheri Hartman is a 81 y.o. female with PMH significant for HTN, DDD, OP, stable CKD, dementia, and unstable gait who presents s/p fall earlier today.  Pt states she got up from a chair and walked a couple of steps on the hardwood floor in her stockings and slipped and fell backward hitting her bottom and back, and she is unsure if she hit her head.  Denies LOC, N/V, CP, SOB, abdominal pain, fevers, chills, or bowel/bladder incontinence. Pt is not on any blood thinners.   Past Medical History  Diagnosis Date  . Hypertension   . History of rheumatic fever   . Degenerative arthritis   . Hyperlipidemia   . Stress incontinence   . Diverticulosis   . Chronic kidney disease     STABLE  . History of fractured pelvis   . DDD (degenerative disc disease), lumbar   . OAB (overactive bladder)   . SBE (subacute bacterial endocarditis) prophylaxis candidate   . Osteoporosis, senile   . Macular degeneration   . Aortic stenosis   . Anemia   . COPD (chronic obstructive pulmonary disease)   . CAD (coronary artery disease)   . Unstable gait   . Edema   . Dementia   . Restrictive lung disease   . Compression fracture of thoracic vertebra   . Fracture, pelvis closed    Past Surgical History  Procedure Laterality Date  . Back surgery      lumbar disc surgery  . Tonsillectomy    . Eye surgery    . Arthroscopic knee surgery on the left     Family History  Problem Relation Age of Onset  . Hypertension Mother   . Hypertension Father    Social History  Substance Use Topics  . Smoking status: Never Smoker   . Smokeless tobacco: Never Used  . Alcohol Use: Yes     Comment: 4 oz wine daily  in the evening   OB History    No data available     Review of Systems All other systems negative unless otherwise stated in HPI    Allergies  Ciprofloxacin; Tramadol; and Latex  Home Medications   Prior to Admission medications   Medication Sig Start Date End Date Taking? Authorizing Provider  donepezil (ARICEPT) 5 MG tablet Take 5 mg by mouth at bedtime.    Historical Provider, MD  furosemide (LASIX) 20 MG tablet Take 1 tablet (20 mg total) by mouth daily. 12/03/14   Corky Crafts, MD  ipratropium-albuterol (DUONEB) 0.5-2.5 (3) MG/3ML SOLN Take 3 mLs by nebulization every 6 (six) hours as needed. 03/21/15   Sharee Holster, NP  LORazepam (ATIVAN) 0.5 MG tablet Take 0.5 mg by mouth every 8 (eight) hours as needed for anxiety.    Historical Provider, MD  losartan (COZAAR) 100 MG tablet Take 0.5 tablets (50 mg total) by mouth daily. 02/22/15   Vassie Loll, MD  Multiple Vitamins-Minerals (ICAPS) CAPS Take 1 capsule by mouth daily.    Historical Provider, MD  nitroGLYCERIN (NITROSTAT) 0.4 MG SL tablet Place 0.4 mg under the tongue every 5 (five) minutes as needed for chest pain.    Historical Provider,  MD  pravastatin (PRAVACHOL) 20 MG tablet Take 1 tablet (20 mg total) by mouth daily. 10/22/14   Dyann Kief, PA-C  sodium chloride (OCEAN) 0.65 % SOLN nasal spray Place 1 spray into both nostrils as needed for congestion.    Historical Provider, MD  Tiotropium Bromide Monohydrate (SPIRIVA RESPIMAT) 1.25 MCG/ACT AERS Inhale 2 puffs into the lungs daily. 03/21/15   Sharee Holster, NP   There were no vitals taken for this visit. Physical Exam  Constitutional: She is oriented to person, place, and time. She appears well-developed and well-nourished.  HENT:  Head: Normocephalic and atraumatic.  Mouth/Throat: Oropharynx is clear and moist.  Eyes: Conjunctivae are normal. Pupils are equal, round, and reactive to light.  Neck: Normal range of motion. Neck supple.  Cardiovascular:  Normal rate and regular rhythm.   Murmur heard. Pulmonary/Chest: Effort normal and breath sounds normal. No respiratory distress. She has no wheezes. She has no rales.  Abdominal: Soft. Bowel sounds are normal. She exhibits no distension. There is no tenderness.  Musculoskeletal: Normal range of motion.       Cervical back: She exhibits tenderness, bony tenderness and pain. She exhibits normal range of motion, no deformity and no spasm.       Thoracic back: Normal.       Lumbar back: She exhibits tenderness and pain. She exhibits normal range of motion, no bony tenderness, no swelling, no edema, no laceration and no spasm.  Lymphadenopathy:    She has no cervical adenopathy.  Neurological: She is alert and oriented to person, place, and time.  Mental Status:   AOx3 Cranial Nerves:  I-not tested  II-PERRLA  III, IV, VI-EOMs intact  V-temporal and masseter strength intact  VII-symmetrical facial movements intact, no facial droop  VIII-hearing grossly intact bilaterally  IX, X-gag intact  XI-strength of sternomastoid and trapezius muscles 5/5  XII-tongue midline Motor:   Good muscle bulk and tone  Strength 4/5 bilaterally in upper and lower extremities   Cerebellar--RAMs, finger to nose intact Sensory:  Intact in upper and lower extremities      Skin: Skin is warm and dry.  No abrasions, lacerations or ecchymosis.   Psychiatric: She has a normal mood and affect. Her behavior is normal.    ED Course  Procedures (including critical care time) Labs Review Labs Reviewed - No data to display  Imaging Review Dg Lumbar Spine Complete  05/03/2015   CLINICAL DATA:  Fall.  Low back pain.  EXAM: LUMBAR SPINE - COMPLETE 4+ VIEW  COMPARISON:  None.  FINDINGS: Patient is status post posterior fusion from L4-S1. Severe anterolisthesis of L5 on S1, approximately 24 mm. No fracture. Mild rightward scoliosis in the upper lumbar spine. Diffuse degenerative disc and facet disease.  IMPRESSION:  Posterior fusion from L4-S1 with severe (grade 3-4) anterolisthesis of L5 on S1.  No fracture.   Electronically Signed   By: Charlett Nose M.D.   On: 05/03/2015 21:10   Dg Sacrum/coccyx  05/03/2015   CLINICAL DATA:  Sacrococcygeal pain after falling when tripping over oxygen tubing today.  EXAM: SACRUM AND COCCYX - 2+ VIEW  COMPARISON:  09/08/2004.  FINDINGS: Pedicle screw and rod fixation at the L4 through S1 levels with grade 2 spondylolisthesis at the L5-S1 level. No acute fractures or subluxations. Diffuse osteopenia. Atheromatous arterial calcifications.  IMPRESSION: 1. No acute fracture or subluxation. 2. Lumbosacral spine postoperative changes and chronic anterolisthesis.   Electronically Signed   By: Zada Finders.D.  On: 05/03/2015 21:11   Ct Head Wo Contrast  05/03/2015   CLINICAL DATA:  Patient status post fall from chair. No reported loss of consciousness.  EXAM: CT HEAD WITHOUT CONTRAST  CT CERVICAL SPINE WITHOUT CONTRAST  TECHNIQUE: Multidetector CT imaging of the head and cervical spine was performed following the standard protocol without intravenous contrast. Multiplanar CT image reconstructions of the cervical spine were also generated.  COMPARISON:  None.  FINDINGS: CT HEAD FINDINGS  Ventricles and sulci are prominent compatible with atrophy. Periventricular and subcortical white matter hypodensity compatible with chronic small vessel ischemic changes. No evidence for acute cortically based infarct, intracranial hemorrhage, mass lesion or mass-effect. Paranasal sinuses are well aerated. Mastoid air cells unremarkable. Calvarium is intact.  CT CERVICAL SPINE FINDINGS  Straightening of the normal cervical lordosis. Grade 1 anterolisthesis of C4 on C5 likely secondary to facet degenerative changes at this level. Degenerative disc disease most pronounced C5-6 and C6-7. No evidence for acute cervical spine fracture. Craniocervical junction is intact. Extensive atherosclerotic calcification of  the carotid bifurcations bilaterally. Heterogeneous attenuation to the thyroid. Lung apices are clear.  IMPRESSION: No acute intracranial process.  No acute cervical spine fracture.  Cortical atrophy and chronic small vessel ischemic changes.  Multilevel degenerative disc and facet disease.   Electronically Signed   By: Annia Belt M.D.   On: 05/03/2015 21:01   Ct Cervical Spine Wo Contrast  05/03/2015   CLINICAL DATA:  Patient status post fall from chair. No reported loss of consciousness.  EXAM: CT HEAD WITHOUT CONTRAST  CT CERVICAL SPINE WITHOUT CONTRAST  TECHNIQUE: Multidetector CT imaging of the head and cervical spine was performed following the standard protocol without intravenous contrast. Multiplanar CT image reconstructions of the cervical spine were also generated.  COMPARISON:  None.  FINDINGS: CT HEAD FINDINGS  Ventricles and sulci are prominent compatible with atrophy. Periventricular and subcortical white matter hypodensity compatible with chronic small vessel ischemic changes. No evidence for acute cortically based infarct, intracranial hemorrhage, mass lesion or mass-effect. Paranasal sinuses are well aerated. Mastoid air cells unremarkable. Calvarium is intact.  CT CERVICAL SPINE FINDINGS  Straightening of the normal cervical lordosis. Grade 1 anterolisthesis of C4 on C5 likely secondary to facet degenerative changes at this level. Degenerative disc disease most pronounced C5-6 and C6-7. No evidence for acute cervical spine fracture. Craniocervical junction is intact. Extensive atherosclerotic calcification of the carotid bifurcations bilaterally. Heterogeneous attenuation to the thyroid. Lung apices are clear.  IMPRESSION: No acute intracranial process.  No acute cervical spine fracture.  Cortical atrophy and chronic small vessel ischemic changes.  Multilevel degenerative disc and facet disease.   Electronically Signed   By: Annia Belt M.D.   On: 05/03/2015 21:01   I have personally  reviewed and evaluated these images and lab results as part of my medical decision-making.   EKG Interpretation None      MDM   Final diagnoses:  None    Patient presents s/p fall onto her buttocks and back now complaining of lower back pain and cervical spine pain.  She is unable to discern if she hit her head or not.  VSS, NAD, non-toxic.  On exam, no focal neurological deficits.  TTP along cervical spine and lumbar spine.  Head CT, cervical spine and plain films of lumbar and sacral spine pending.    Plain films of sacrum/coccyx and lumbar spine shows no fracture of subluxation.  CT head and cervical spine show no acute abnormalities.  Case has been discussed with and seen by Dr. Rubin Payor who agrees with the above plan for discharge.Cheri Fowler, PA-C 05/03/15 2213  Benjiman Core, MD 05/04/15 [phone]

## 2015-05-03 NOTE — ED Notes (Signed)
Bed: WU98 Expected date:  Expected time:  Means of arrival:  Comments: Ems  7 female fall

## 2015-05-03 NOTE — ED Notes (Signed)
This Clinical research associate did error in charting pt given 325 mg po Tylenol only NOT 975 mg rectum

## 2015-05-03 NOTE — Discharge Instructions (Signed)
Fall Prevention and Home Safety °Falls cause injuries and can affect all age groups. It is possible to prevent falls.  °HOW TO PREVENT FALLS °· Wear shoes with rubber soles that do not have an opening for your toes. °· Keep the inside and outside of your house well lit. °· Use night lights throughout your home. °· Remove clutter from floors. °· Clean up floor spills. °· Remove throw rugs or fasten them to the floor with carpet tape. °· Do not place electrical cords across pathways. °· Put grab bars by your tub, shower, and toilet. Do not use towel bars as grab bars. °· Put handrails on both sides of the stairway. Fix loose handrails. °· Do not climb on stools or stepladders, if possible. °· Do not wax your floors. °· Repair uneven or unsafe sidewalks, walkways, or stairs. °· Keep items you use a lot within reach. °· Be aware of pets. °· Keep emergency numbers next to the telephone. °· Put smoke detectors in your home and near bedrooms. °Ask your doctor what other things you can do to prevent falls. °Document Released: 05/23/2009 Document Revised: 01/26/2012 Document Reviewed: 10/27/2011 °ExitCare® Patient Information ©2015 [employer]. This information is not intended to replace advice given to you by your health care provider. Make sure you discuss any questions you have with your health care provider. ° °

## 2015-05-03 NOTE — ED Notes (Signed)
[facility] called for patient transport 

## 2015-05-08 ENCOUNTER — Other Ambulatory Visit: Payer: Self-pay | Admitting: Nurse Practitioner

## 2015-05-09 ENCOUNTER — Non-Acute Institutional Stay (SKILLED_NURSING_FACILITY): Payer: Medicare Other | Admitting: Nurse Practitioner

## 2015-05-09 ENCOUNTER — Encounter: Payer: Self-pay | Admitting: Nurse Practitioner

## 2015-05-09 DIAGNOSIS — N182 Chronic kidney disease, stage 2 (mild): Secondary | ICD-10-CM

## 2015-05-09 DIAGNOSIS — I10 Essential (primary) hypertension: Secondary | ICD-10-CM | POA: Diagnosis not present

## 2015-05-09 DIAGNOSIS — R2681 Unsteadiness on feet: Secondary | ICD-10-CM

## 2015-05-09 DIAGNOSIS — J439 Emphysema, unspecified: Secondary | ICD-10-CM

## 2015-05-09 DIAGNOSIS — K222 Esophageal obstruction: Secondary | ICD-10-CM

## 2015-05-09 DIAGNOSIS — F418 Other specified anxiety disorders: Secondary | ICD-10-CM | POA: Diagnosis not present

## 2015-05-09 DIAGNOSIS — F0390 Unspecified dementia without behavioral disturbance: Secondary | ICD-10-CM | POA: Diagnosis not present

## 2015-05-09 NOTE — Assessment & Plan Note (Signed)
04/12/15 Bun/creat 34/1.84, continue to monitor.

## 2015-05-09 NOTE — Assessment & Plan Note (Signed)
Still able to swallow w/o apparent difficulty.

## 2015-05-09 NOTE — Progress Notes (Signed)
Patient ID: Sheri Hartman, female   DOB: Oct 19, 1918, 81 y.o.   MRN: [id number]  Location:  SNF [facility] Provider:  Chipper Oman NP  Code Status:  DNR Goals of care: Advanced Directive information    Chief Complaint  Patient presents with  . Medical Management of Chronic Issues     HPI: Patient is a 81 y.o. female seen in the SNF at [facility] today for evaluation of chronic medical conditions. 05/03/15 ED evaluation, Pt stated she got up from a chair and walked a couple of steps on the hardwood floor in her stockings and slipped and fell backward hitting her bottom and back, and she is unsure if she hit her head.X-ray lumbar and sacrum: Posterior fusion from L4-S1 with severe (grade 3-4) anterolisthesis of L5 on S1. CT head and cervical spine: no acute intracranial or spinal process.   Review of Systems:  Review of Systems  Constitutional: Negative for fever, chills and diaphoresis.  HENT: Negative for congestion, ear discharge, ear pain, hearing loss and sore throat.   Eyes: Negative for pain and redness.  Respiratory: Positive for shortness of breath. Negative for cough and wheezing.        Oxygen dependent  Cardiovascular: Positive for leg swelling. Negative for chest pain and palpitations.  Gastrointestinal: Negative for nausea, abdominal pain, diarrhea and constipation.  Genitourinary: Negative for dysuria, urgency and frequency.  Musculoskeletal: Negative for myalgias, back pain and neck pain.  Skin: Negative for rash.  Neurological: Negative for dizziness, tremors, seizures, weakness and headaches.       Memory disorder  Endo/Heme/Allergies: Negative for polydipsia. Does not bruise/bleed easily.       Anemic  Psychiatric/Behavioral: Positive for memory loss. Negative for suicidal ideas and hallucinations. The patient is not nervous/anxious.     Past Medical History  Diagnosis Date  . Hypertension   . History of rheumatic fever   . Degenerative arthritis   .  Hyperlipidemia   . Stress incontinence   . Diverticulosis   . Chronic kidney disease     STABLE  . History of fractured pelvis   . DDD (degenerative disc disease), lumbar   . OAB (overactive bladder)   . SBE (subacute bacterial endocarditis) prophylaxis candidate   . Osteoporosis, senile   . Macular degeneration   . Aortic stenosis   . Anemia   . COPD (chronic obstructive pulmonary disease)   . CAD (coronary artery disease)   . Unstable gait   . Edema   . Dementia   . Restrictive lung disease   . Compression fracture of thoracic vertebra   . Fracture, pelvis closed     Patient Active Problem List   Diagnosis Date Noted  . Esophageal stricture 03/24/2015  . Depression with anxiety 03/24/2015  . COPD (chronic obstructive pulmonary disease) with emphysema 03/21/2015  . Unstable gait   . Edema   . Dementia   . Restrictive lung disease   . Hypoxia 02/21/2015  . Hypotension 02/21/2015  . Monocytosis 03/08/2014  . Chronic kidney disease 03/08/2014  . Aortic stenosis   . Hypertension   . History of rheumatic fever   . Degenerative arthritis   . Hyperlipidemia   . Stress incontinence   . Diverticulosis   . DDD (degenerative disc disease), cervical   . OAB (overactive bladder)   . SBE (subacute bacterial endocarditis) prophylaxis candidate   . Anemia in chronic kidney disease 08/29/2011  . Dyspnea 08/28/2011  . Osteoporosis 08/28/2011    Allergies  Allergen Reactions  . Ciprofloxacin Other (See Comments)    dizziness and disorientation  . Tramadol Other (See Comments)    Disorientation, disconnected   . Latex Other (See Comments)    Unknown....years ago....eczema    Medications: Patient's Medications  New Prescriptions   No medications on file  Previous Medications   ACETAMINOPHEN (TYLENOL) 325 MG TABLET    Take 1 tablet (325 mg total) by mouth every 6 (six) hours as needed.   DONEPEZIL (ARICEPT) 10 MG TABLET    Take 10 mg by mouth daily.   DONEPEZIL  (ARICEPT) 5 MG TABLET    Take 5 mg by mouth at bedtime.   FUROSEMIDE (LASIX) 20 MG TABLET    Take 1 tablet (20 mg total) by mouth daily.   IPRATROPIUM-ALBUTEROL (DUONEB) 0.5-2.5 (3) MG/3ML SOLN    Take 3 mLs by nebulization every 6 (six) hours as needed.   LACTOSE FREE NUTRITION (BOOST) LIQD    Take 237 mLs by mouth 2 (two) times daily between meals.   LORAZEPAM (ATIVAN) 0.5 MG TABLET    Take 0.5 mg by mouth daily.    LOSARTAN (COZAAR) 100 MG TABLET    Take 0.5 tablets (50 mg total) by mouth daily.   MULTIPLE VITAMINS-MINERALS (MULTIVITAMIN ADULT PO)    Take 1 tablet by mouth daily.   NITROGLYCERIN (NITROSTAT) 0.4 MG SL TABLET    Place 0.4 mg under the tongue every 5 (five) minutes as needed for chest pain.   PRAVASTATIN (PRAVACHOL) 20 MG TABLET    Take 1 tablet (20 mg total) by mouth daily.   SERTRALINE (ZOLOFT) 25 MG TABLET    Take 25 mg by mouth daily.   TIOTROPIUM BROMIDE MONOHYDRATE (SPIRIVA RESPIMAT) 1.25 MCG/ACT AERS    Inhale 2 puffs into the lungs daily.  Modified Medications   No medications on file  Discontinued Medications   No medications on file    Physical Exam: Filed Vitals:   05/09/15 1128  BP: 105/68  Pulse: 69  Temp: 99.2 F (37.3 C)  TempSrc: Tympanic  Resp: 18   There is no weight on file to calculate BMI.  Physical Exam  Constitutional: She appears well-developed and well-nourished. No distress.  Frail  HENT:  Right Ear: External ear normal.  Left Ear: External ear normal.  Nose: Nose normal.  Mouth/Throat: Oropharynx is clear and moist. No oropharyngeal exudate.  Eyes: Conjunctivae and EOM are normal. Pupils are equal, round, and reactive to light. No scleral icterus.  Neck: No JVD present. No tracheal deviation present. No thyromegaly present.  Cardiovascular: Normal rate, regular rhythm, normal heart sounds and intact distal pulses.  Exam reveals no gallop and no friction rub.   No murmur heard. Pulmonary/Chest: No respiratory distress. She has no  wheezes. She exhibits no tenderness.  Oxygen dependent. Tachypnea. Dry rales bilaterally.  Abdominal: She exhibits no distension and no mass. There is no tenderness.  Musculoskeletal: Normal range of motion. She exhibits edema. She exhibits no tenderness.  Unstable gait. Using walker. BLE 1+ edema  Lymphadenopathy:    She has no cervical adenopathy.  Neurological: She is alert. No cranial nerve deficit. Coordination normal.  Unable to give her history. Does not recall being in the hospital.  Skin: No rash noted. She is not diaphoretic. No erythema. No pallor.  Psychiatric: She has a normal mood and affect. Her behavior is normal. Thought content normal.  forgetful    Labs reviewed: Basic Metabolic Panel:  Recent Labs  78/29/56 1311 02/22/15 0850  04/12/15  NA 130* 132* 1*  144  K 4.6 4.3 4.4  CL 92* 94*  --   CO2 31 30  --   GLUCOSE 100* 88  --   BUN 39* 35* 34*  CREATININE 1.66* 1.51* 1.8*  CALCIUM 8.7* 8.5*  --     Liver Function Tests: No results for input(s): AST, ALT, ALKPHOS, BILITOT, PROT, ALBUMIN in the last 8760 hours.  CBC:  Recent Labs  02/21/15 1311 02/22/15 0850  WBC 11.0* 10.3  NEUTROABS 7.4  --   HGB 10.7* 11.1*  HCT 31.8* 34.2*  MCV 96.1 96.3  PLT 323 335    Lab Results  Component Value Date   TSH 1.30 08/28/2011   No results found for: HGBA1C No results found for: CHOL, HDL, LDLCALC, LDLDIRECT, TRIG, CHOLHDL  Significant Diagnostic Results since last visit: none  Patient Care Team: Juluis Rainier, MD as PCP - General (Family Medicine) Donovan Kail, MD as Attending Physician (Obstetrics and Gynecology) Corky Crafts, MD as Attending Physician (Cardiology) Purcell Nails, MD as Attending Physician (Cardiothoracic Surgery) Cristopher Peru, NP as Nurse Practitioner (Cardiology) Nyoka Cowden, MD as Attending Physician (Pulmonary Disease) [facility] Mast X, NP as Nurse Practitioner (Nurse  Practitioner)  Assessment/Plan Problem List Items Addressed This Visit    Unstable gait - Primary    Continue w/c for mobility, frequent falling, last fall 05/03/15 ED evaluation with negative X-ray lumbar spine, sacrum. CT head and cervical spine no acute process.       Hypertension    Controlled, continue Losartan and Furosemide.       Esophageal stricture    Still able to swallow w/o apparent difficulty.       Depression with anxiety    Stabilized, continue Sertraline       Dementia    Adjusted to SNF well, forgetful, close supervision needed      COPD (chronic obstructive pulmonary disease) with emphysema    Saw Pulmonology in the past, continue Spiriva and DuoNeb, stable.       Chronic kidney disease    04/12/15 Bun/creat 34/1.84, continue to monitor.           Family/ staff Communication: continue SNF for care needs  Labs/tests ordered:  none  [facility] Mast NP Geriatrics [facility] [address] [place], [place] [address] On Call:  [phone] & follow prompts after 5pm & weekends Office Phone:  [phone] Office Fax:  [phone]

## 2015-05-09 NOTE — Assessment & Plan Note (Signed)
Adjusted to SNF well, forgetful, close supervision needed

## 2015-05-09 NOTE — Assessment & Plan Note (Signed)
Continue w/c for mobility, frequent falling, last fall 05/03/15 ED evaluation with negative X-ray lumbar spine, sacrum. CT head and cervical spine no acute process.

## 2015-05-09 NOTE — Assessment & Plan Note (Signed)
Saw Pulmonology in the past, continue Spiriva and DuoNeb, stable.

## 2015-05-09 NOTE — Assessment & Plan Note (Signed)
Stabilized, continue Sertraline 25mg 

## 2015-05-09 NOTE — Assessment & Plan Note (Signed)
Controlled, continue Losartan and Furosemide.

## 2015-05-16 LAB — HEPATIC FUNCTION PANEL
ALT: 11 U/L (ref 7–35)
AST: 17 U/L (ref 13–35)
Alkaline Phosphatase: 45 U/L (ref 25–125)
BILIRUBIN, TOTAL: 0.5 mg/dL

## 2015-05-16 LAB — CBC AND DIFFERENTIAL
HEMATOCRIT: 26 % — AB (ref 36–46)
HEMOGLOBIN: 8.2 g/dL — AB (ref 12.0–16.0)
Platelets: 314 10*3/uL (ref 150–399)
WBC: 12.4 10*3/mL

## 2015-05-16 LAB — BASIC METABOLIC PANEL
BUN: 55 mg/dL — AB (ref 4–21)
Creatinine: 1.5 mg/dL — AB (ref <=1.1)
GLUCOSE: 156 mg/dL
Potassium: 4.5 mmol/L (ref 3.4–5.3)
Sodium: 148 mmol/L — AB (ref 137–147)

## 2015-05-17 ENCOUNTER — Non-Acute Institutional Stay (SKILLED_NURSING_FACILITY): Payer: Medicare Other | Admitting: Internal Medicine

## 2015-05-17 DIAGNOSIS — D631 Anemia in chronic kidney disease: Secondary | ICD-10-CM | POA: Diagnosis not present

## 2015-05-17 DIAGNOSIS — N189 Chronic kidney disease, unspecified: Secondary | ICD-10-CM

## 2015-05-17 DIAGNOSIS — F0390 Unspecified dementia without behavioral disturbance: Secondary | ICD-10-CM | POA: Diagnosis not present

## 2015-05-17 DIAGNOSIS — N182 Chronic kidney disease, stage 2 (mild): Secondary | ICD-10-CM

## 2015-05-17 DIAGNOSIS — I5033 Acute on chronic diastolic (congestive) heart failure: Secondary | ICD-10-CM | POA: Diagnosis not present

## 2015-05-17 DIAGNOSIS — I350 Nonrheumatic aortic (valve) stenosis: Secondary | ICD-10-CM | POA: Diagnosis not present

## 2015-05-17 DIAGNOSIS — Z66 Do not resuscitate: Secondary | ICD-10-CM | POA: Diagnosis not present

## 2015-05-17 MED ORDER — FUROSEMIDE 40 MG PO TABS: ORAL_TABLET | ORAL | Status: AC

## 2015-05-17 MED ORDER — FERROUS SULFATE 325 (65 FE) MG PO TABS
ORAL_TABLET | ORAL | Status: AC
Start: 2015-05-17 — End: ?

## 2015-05-17 MED ORDER — METOPROLOL TARTRATE 25 MG PO TABS: ORAL_TABLET | ORAL | Status: AC

## 2015-05-17 MED ORDER — SPIRONOLACTONE 25 MG PO TABS: ORAL_TABLET | ORAL | Status: AC

## 2015-05-17 NOTE — Progress Notes (Signed)
Patient ID: Sheri Hartman, female   DOB: 11/28/1918, 81 y.o.   MRN: [id number]    [id number]  Place of Service: SNF (31)     Allergies  Allergen Reactions  . Ciprofloxacin Other (See Comments)    dizziness and disorientation  . Tramadol Other (See Comments)    Disorientation, disconnected   . Latex Other (See Comments)    Unknown....years ago....eczema    Chief Complaint  Patient presents with  . Medical Management of Chronic Issues    HPI:  Patient was seen acutely today in follow-up of a decline in her condition. She has been increasingly short of breath over the last 48 hours. Chest x-ray done today was indicative of congestive heart failure. There is no evidence for pneumonia. Chest x-ray has changed since the last one done 03/06/2015. There is cardiomegaly and mild diffuse pulmonary venous congestion with linear interstitial edema patient also had lab work done 05/16/2015. She is anemic with a hemoglobin of 8.2. MCV is 99.6. White cells are modestly elevated at 12.4. CMP showed a sodium of 148 glucose 156 BUN 55 and creatinine 1.53 (similar to ones obtained 04/12/2015).  Hemoglobin has fallen since her last check in September. It dropped from 11.2 down to 8.2. There has been no obvious blood loss. Anemia has previously been attributed to her chronic renal disease, but this would be unusual to see this rapid a loss and hemoglobin.  Medications: Patient's Medications  New Prescriptions   No medications on file  Previous Medications   ACETAMINOPHEN (TYLENOL) 325 MG TABLET    Take 1 tablet (325 mg total) by mouth every 6 (six) hours as needed.   FUROSEMIDE (LASIX) 20 MG TABLET    Take 1 tablet (20 mg total) by mouth daily.   IPRATROPIUM-ALBUTEROL (DUONEB) 0.5-2.5 (3) MG/3ML SOLN    Take 3 mLs by nebulization every 6 (six) hours as needed.   LACTOSE FREE NUTRITION (BOOST) LIQD    Take 237 mLs by mouth 2 (two) times daily between  meals.   LORAZEPAM (ATIVAN) 0.5 MG TABLET    Take 0.5 mg by mouth daily. One tablet every 6 hours as needed for anxiety   LOSARTAN (COZAAR) 100 MG TABLET    Take 0.5 tablets (50 mg total) by mouth daily.   MULTIPLE VITAMINS-MINERALS (MULTIVITAMIN ADULT PO)    Take 1 tablet by mouth daily.   NITROGLYCERIN (NITROSTAT) 0.4 MG SL TABLET    Place 0.4 mg under the tongue every 5 (five) minutes as needed for chest pain.   PRAVASTATIN (PRAVACHOL) 20 MG TABLET    Take 1 tablet (20 mg total) by mouth daily.   SERTRALINE (ZOLOFT) 25 MG TABLET    Take 25 mg by mouth daily.   TIOTROPIUM BROMIDE MONOHYDRATE (SPIRIVA RESPIMAT) 1.25 MCG/ACT AERS    Inhale 2 puffs into the lungs daily.  Modified Medications   No medications on file  Discontinued Medications   DONEPEZIL (ARICEPT) 10 MG TABLET    Take 10 mg by mouth daily.   DONEPEZIL (ARICEPT) 5 MG TABLET    Take 5 mg by mouth at bedtime.     Review of Systems  Constitutional: Negative for fever, chills, diaphoresis, activity change, appetite change, fatigue and unexpected weight change.       FRAIL  HENT: Negative for congestion, ear discharge, ear pain, hearing loss, postnasal drip, rhinorrhea, sore throat, tinnitus, trouble swallowing and voice change.   Eyes: Positive for visual disturbance (  corrective lenses). Negative for pain, redness and itching.  Respiratory: Positive for shortness of breath. Negative for cough, choking, chest tightness and wheezing.        Oxygen dependent  Cardiovascular: Negative for chest pain, palpitations and leg swelling.  Gastrointestinal: Negative for nausea, abdominal pain, diarrhea, constipation and abdominal distention.       States " I'm  Fine"  Endocrine: Negative for cold intolerance, heat intolerance, polydipsia, polyphagia and polyuria.  Genitourinary: Negative for dysuria, urgency, frequency, hematuria, flank pain, vaginal discharge, difficulty urinating and pelvic pain.  Musculoskeletal: Positive for gait  problem. Negative for myalgias, back pain, arthralgias, neck pain and neck stiffness.  Skin: Negative for color change, pallor and rash.  Allergic/Immunologic: Negative.   Neurological: Negative for dizziness, tremors, seizures, syncope, weakness, numbness and headaches.       Memory disorder  Hematological: Negative for adenopathy. Does not bruise/bleed easily.       Anemic  Psychiatric/Behavioral: Positive for confusion. Negative for suicidal ideas, hallucinations, behavioral problems, sleep disturbance, dysphoric mood and agitation. The patient is nervous/anxious. The patient is not hyperactive.   All other systems reviewed and are negative.   There were no vitals filed for this visit. There is no weight on file to calculate BMI.  Physical Exam  Constitutional: She appears well-developed and well-nourished. No distress.  Frail  HENT:  Right Ear: External ear normal.  Left Ear: External ear normal.  Nose: Nose normal.  Mouth/Throat: Oropharynx is clear and moist. No oropharyngeal exudate.  Eyes: Conjunctivae and EOM are normal. Pupils are equal, round, and reactive to light. No scleral icterus.  Neck: No JVD present. No tracheal deviation present. No thyromegaly present.  Cardiovascular: Normal rate, regular rhythm, normal heart sounds and intact distal pulses.  Exam reveals no gallop and no friction rub.   No murmur heard. Pulmonary/Chest: She is in respiratory distress. She has no wheezes. She exhibits no tenderness.  Oxygen dependent. Tachypnea. Dry rales bilaterally.  Abdominal: She exhibits no distension and no mass. There is no tenderness.  Musculoskeletal: Normal range of motion. She exhibits edema. She exhibits no tenderness.  Unstable gait. Using walker.  Lymphadenopathy:    She has no cervical adenopathy.  Neurological: She is alert. No cranial nerve deficit. Coordination normal.  Sluggish and poorly responsive  Skin: No rash noted. She is not diaphoretic. No erythema.  No pallor.  Psychiatric: She has a normal mood and affect. Her behavior is normal. Thought content normal.     Labs reviewed: Lab Summary Latest Ref Rng 05/16/2015 04/12/2015 04/12/2015 02/22/2015 02/21/2015  Hemoglobin 12.0 - 16.0 g/dL 8.2(A) (None) (None) 11.1(L) 10.7(L)  Hematocrit 36 - 46 % 26(A) (None) (None) 34.2(L) 31.8(L)  White count - 12.4 (None) (None) 10.3 11.0(H)  Platelet count 150 - 399 K/L 314 (None) (None) 335 323  Sodium 137 - 147 mmol/L 148(A) 1(A) 144 132(L) 130(L)  Potassium 3.4 - 5.3 mmol/L 4.5 (None) 4.4 4.3 4.6  Calcium 8.9 - 10.3 mg/dL (None) (None) (None) 8.5(L) 8.7(L)  Phosphorus - (None) (None) (None) (None) (None)  Creatinine .5 - 1.1 mg/dL 1.5(A) (None) 1.8(A) 1.51(H) 1.66(H)  AST 13 - 35 U/L 17 (None) (None) (None) (None)  Alk Phos 25 - 125 U/L 45 (None) (None) (None) (None)  Bilirubin - (None) (None) (None) (None) (None)  Glucose - 156 (None) 95 88 100(H)  Cholesterol - (None) (None) (None) (None) (None)  HDL cholesterol - (None) (None) (None) (None) (None)  Triglycerides - (None) (None) (None) (None) (None)  LDL   Direct - (None) (None) (None) (None) (None)  LDL Calc - (None) (None) (None) (None) (None)  Total protein - (None) (None) (None) (None) (None)  Albumin - (None) (None) (None) (None) (None)   Lab Results  Component Value Date   TSH 1.30 08/28/2011   Lab Results  Component Value Date   BUN 34* 04/12/2015   No results found for: HGBA1C     Assessment/Plan  1. Acute on chronic diastolic CHF (congestive heart failure) (HCC) Prognosis is terrible. She is NCB. I spoke to her husband at the bedside. Her daughter is coming to town. Already on losartan - furosemide (LASIX) 40 MG tablet; One daily to help control edema and CHF  Dispense: 30 tablet; Refill: 3 - spironolactone (ALDACTONE) 25 MG tablet; One each morning to reduce edema and to strengthen the heart  Dispense: 30 tablet; Refill: 5 - metoprolol tartrate (LOPRESSOR) 25 MG tablet; One  twice daily to control blood pressure and for CHF  Dispense: 60 tablet; Refill: 5  2. Chronic kidney disease, stage 2 (mild) -CMP, future  3. Aortic stenosis -May contribute to congestive heart failure  4. Anemia in chronic kidney disease Check stool for blood -CBC, reticulocyte count, serum iron and iron saturation -Ferrous sulfate 325 mg daily  5. Dementia, without behavioral disturbance Patient has been taken off of Aricept  6. Advance directive indicates patient wish for do-not-resuscitate status - DNR (Do Not Resuscitate)      

## 2015-05-22 ENCOUNTER — Non-Acute Institutional Stay (SKILLED_NURSING_FACILITY): Payer: Medicare Other | Admitting: Nurse Practitioner

## 2015-05-22 ENCOUNTER — Encounter: Payer: Self-pay | Admitting: Nurse Practitioner

## 2015-05-22 DIAGNOSIS — R627 Adult failure to thrive: Secondary | ICD-10-CM | POA: Diagnosis not present

## 2015-05-22 DIAGNOSIS — R601 Generalized edema: Secondary | ICD-10-CM | POA: Diagnosis not present

## 2015-05-22 DIAGNOSIS — J439 Emphysema, unspecified: Secondary | ICD-10-CM

## 2015-05-22 DIAGNOSIS — F0390 Unspecified dementia without behavioral disturbance: Secondary | ICD-10-CM

## 2015-05-22 DIAGNOSIS — N390 Urinary tract infection, site not specified: Secondary | ICD-10-CM

## 2015-05-22 DIAGNOSIS — N189 Chronic kidney disease, unspecified: Secondary | ICD-10-CM

## 2015-05-22 DIAGNOSIS — F418 Other specified anxiety disorders: Secondary | ICD-10-CM | POA: Diagnosis not present

## 2015-05-22 DIAGNOSIS — D631 Anemia in chronic kidney disease: Secondary | ICD-10-CM | POA: Diagnosis not present

## 2015-05-22 DIAGNOSIS — I10 Essential (primary) hypertension: Secondary | ICD-10-CM

## 2015-05-22 DIAGNOSIS — I5033 Acute on chronic diastolic (congestive) heart failure: Secondary | ICD-10-CM | POA: Diagnosis not present

## 2015-05-22 NOTE — Assessment & Plan Note (Signed)
Not apparent, limited oral intake, dc Furosemide.  

## 2015-05-22 NOTE — Assessment & Plan Note (Signed)
Compensated clinically, no apparent edema, no increased moist rales in lungs. Dc Furosemide due to limited oral intake.

## 2015-05-22 NOTE — Assessment & Plan Note (Addendum)
Controlled, continue Losartan and nitroglycerine.

## 2015-05-22 NOTE — Progress Notes (Signed)
Patient ID: Sheri Hartman, female   DOB: [birth year], 81 y.o.   MRN: [id number]  Location:  SNF [facility] Provider:  Chipper Oman NP  Code Status:  DNR Goals of care: Advanced Directive information    Chief Complaint  Patient presents with  . Medical Management of Chronic Issues  . Acute Visit    UTI, comfort measures.      HPI: Patient is a 81 y.o. female seen in the SNF at [facility] today for evaluation of UTI, anemia,  FTT, comfort measures. Hx of dementia and COPD, advanced and continue to decline, POA desires comfort measures for care needs  Review of Systems:  Review of Systems  Constitutional: Positive for malaise/fatigue. Negative for fever, chills and diaphoresis.  HENT: Positive for hearing loss. Negative for congestion and ear discharge.   Eyes: Negative for pain and redness.  Respiratory: Positive for shortness of breath. Negative for cough and wheezing.        Oxygen dependent  Cardiovascular: Positive for leg swelling. Negative for chest pain and palpitations.       Minimal  Gastrointestinal: Negative for nausea, abdominal pain, diarrhea and constipation.  Genitourinary: Positive for frequency. Negative for dysuria and urgency.  Musculoskeletal: Negative for myalgias, back pain and neck pain.  Skin: Negative for rash.  Neurological: Positive for weakness. Negative for dizziness, tremors, seizures and headaches.       Memory disorder  Endo/Heme/Allergies: Negative for polydipsia. Does not bruise/bleed easily.       Anemic  Psychiatric/Behavioral: Positive for memory loss. Negative for suicidal ideas and hallucinations. The patient is not nervous/anxious.     Past Medical History  Diagnosis Date  . Hypertension   . History of rheumatic fever   . Degenerative arthritis   . Hyperlipidemia   . Stress incontinence   . Diverticulosis   . Chronic kidney disease     STABLE  . History of fractured pelvis   . DDD (degenerative disc disease), lumbar   . OAB  (overactive bladder)   . SBE (subacute bacterial endocarditis) prophylaxis candidate   . Osteoporosis, senile   . Macular degeneration   . Aortic stenosis   . Anemia   . COPD (chronic obstructive pulmonary disease) (HCC)   . CAD (coronary artery disease)   . Unstable gait   . Edema   . Dementia   . Restrictive lung disease   . Compression fracture of thoracic vertebra (HCC)   . Fracture, pelvis closed [facility])     Patient Active Problem List   Diagnosis Date Noted  . Infection of urinary tract 05/22/2015  . FTT (failure to thrive) in adult 05/22/2015  . Acute on chronic diastolic CHF (congestive heart failure) (HCC) 05/17/2015  . Esophageal stricture 03/24/2015  . Depression with anxiety 03/24/2015  . COPD (chronic obstructive pulmonary disease) with emphysema (HCC) 03/21/2015  . Unstable gait   . Edema   . Dementia   . Restrictive lung disease   . Hypoxia 02/21/2015  . Hypotension 02/21/2015  . Monocytosis 03/08/2014  . Chronic kidney disease 03/08/2014  . Aortic stenosis   . Hypertension   . History of rheumatic fever   . Degenerative arthritis   . Hyperlipidemia   . Stress incontinence   . Diverticulosis   . DDD (degenerative disc disease), cervical   . OAB (overactive bladder)   . SBE (subacute bacterial endocarditis) prophylaxis candidate   . Anemia in chronic kidney disease 08/29/2011  . Dyspnea 08/28/2011  . Osteoporosis 08/28/2011  Allergies  Allergen Reactions  . Ciprofloxacin Other (See Comments)    dizziness and disorientation  . Tramadol Other (See Comments)    Disorientation, disconnected   . Latex Other (See Comments)    Unknown....years ago....eczema    Medications: Patient's Medications  New Prescriptions   No medications on file  Previous Medications   ACETAMINOPHEN (TYLENOL) 325 MG TABLET    Take 1 tablet (325 mg total) by mouth every 6 (six) hours as needed.   FERROUS SULFATE 325 (65 FE) MG TABLET    Take one tablet daily to correct  anemia   FUROSEMIDE (LASIX) 40 MG TABLET    One daily to help control edema and CHF   IPRATROPIUM-ALBUTEROL (DUONEB) 0.5-2.5 (3) MG/3ML SOLN    Take 3 mLs by nebulization every 6 (six) hours as needed.   LACTOSE FREE NUTRITION (BOOST) LIQD    Take 237 mLs by mouth 2 (two) times daily between meals.   LORAZEPAM (ATIVAN) 0.5 MG TABLET    Take 0.5 mg by mouth daily. One tablet every 6 hours as needed for anxiety   LOSARTAN (COZAAR) 100 MG TABLET    Take 0.5 tablets (50 mg total) by mouth daily.   METOPROLOL TARTRATE (LOPRESSOR) 25 MG TABLET    One twice daily to control blood pressure and for CHF   MULTIPLE VITAMINS-MINERALS (MULTIVITAMIN ADULT PO)    Take 1 tablet by mouth daily.   NITROGLYCERIN (NITROSTAT) 0.4 MG SL TABLET    Place 0.4 mg under the tongue every 5 (five) minutes as needed for chest pain.   PRAVASTATIN (PRAVACHOL) 20 MG TABLET    Take 1 tablet (20 mg total) by mouth daily.   SERTRALINE (ZOLOFT) 25 MG TABLET    Take 25 mg by mouth daily.   SPIRONOLACTONE (ALDACTONE) 25 MG TABLET    One each morning to reduce edema and to strengthen the heart   TIOTROPIUM BROMIDE MONOHYDRATE (SPIRIVA RESPIMAT) 1.25 MCG/ACT AERS    Inhale 2 puffs into the lungs daily.  Modified Medications   No medications on file  Discontinued Medications   No medications on file    Physical Exam: Filed Vitals:   05/22/15 1347  BP: 136/66  Pulse: 70  Temp: 98.6 F (37 C)  TempSrc: Tympanic  Resp: 19   There is no weight on file to calculate BMI.  Physical Exam  Constitutional: She appears well-developed and well-nourished. No distress.  Frail  HENT:  Right Ear: External ear normal.  Left Ear: External ear normal.  Nose: Nose normal.  Mouth/Throat: Oropharynx is clear and moist. No oropharyngeal exudate.  Eyes: Conjunctivae and EOM are normal. Pupils are equal, round, and reactive to light. No scleral icterus.  Neck: No JVD present. No tracheal deviation present. No thyromegaly present.    Cardiovascular: Normal rate, regular rhythm, normal heart sounds and intact distal pulses.  Exam reveals no gallop and no friction rub.   No murmur heard. Pulmonary/Chest: No respiratory distress. She has no wheezes. She exhibits no tenderness.  Oxygen dependent. Tachypnea. Dry rales bilaterally.  Abdominal: She exhibits no distension and no mass. There is no tenderness.  Musculoskeletal: Normal range of motion. She exhibits edema. She exhibits no tenderness.  Unstable gait. Using walker. BLE trace edema  Neurological: She is alert. No cranial nerve deficit. Coordination normal.  Unable to give her history. Does not recall being in the hospital.  Skin: No rash noted. She is not diaphoretic. No erythema. There is pallor.  Psychiatric: She has a  normal mood and affect. Her behavior is normal. Thought content normal.  forgetful    Labs reviewed: Basic Metabolic Panel:  Recent Labs  16/10/96 1311 02/22/15 0850 04/12/15 05/16/15  NA 130* 132* 1*  144 148*  K 4.6 4.3 4.4 4.5  CL 92* 94*  --   --   CO2 31 30  --   --   GLUCOSE 100* 88  --   --   BUN 39* 35* 34* 55*  CREATININE 1.66* 1.51* 1.8* 1.5*  CALCIUM 8.7* 8.5*  --   --     Liver Function Tests:  Recent Labs  05/16/15  AST 17  ALT 11  ALKPHOS 45    CBC:  Recent Labs  02/21/15 1311 02/22/15 0850 05/16/15  WBC 11.0* 10.3 12.4  NEUTROABS 7.4  --   --   HGB 10.7* 11.1* 8.2*  HCT 31.8* 34.2* 26*  MCV 96.1 96.3  --   PLT 323 335 314    Lab Results  Component Value Date   TSH 1.30 08/28/2011   No results found for: HGBA1C No results found for: CHOL, HDL, LDLCALC, LDLDIRECT, TRIG, CHOLHDL  Significant Diagnostic Results since last visit: none  Patient Care Team: Juluis Rainier, MD as PCP - General (Family Medicine) Donovan Kail, MD as Attending Physician (Obstetrics and Gynecology) Corky Crafts, MD as Attending Physician (Cardiology) Purcell Nails, MD as Attending Physician (Cardiothoracic  Surgery) Cristopher Peru, NP as Nurse Practitioner (Cardiology) Nyoka Cowden, MD as Attending Physician (Pulmonary Disease) [facility] Man Mast X, NP as Nurse Practitioner (Nurse Practitioner)  Assessment/Plan Problem List Items Addressed This Visit    Infection of urinary tract - Primary    05/20/15 urine culture E. Coli, 85,000c/ml 7 day course of Septra DS bid, tolerated.       Hypertension    Controlled, continue Losartan and nitroglycerine.       FTT (failure to thrive) in adult    In setting of CHF, COPD, dementia, continue Hospice Service, goal of care is comfort measures.       Edema    Not apparent, limited oral intake, dc Furosemide.       Depression with anxiety    Stabilized, continue Sertraline       Dementia    Progressing, off Aricept, no longer beneficial, goal of care is comfort measures, continue Hospice service.       COPD (chronic obstructive pulmonary disease) with emphysema (HCC)    Saw Pulmonology in the past, continue Spiriva and DuoNeb, O2 dependent.      Anemia in chronic kidney disease    05/21/15 Hgb 7.8, Iron 45, no active bleed, continue to monitor the patient.       Acute on chronic diastolic CHF (congestive heart failure) (HCC)    Compensated clinically, no apparent edema, no increased moist rales in lungs. Dc Furosemide due to limited oral intake.           Family/ staff Communication: continue Hospice service for comfort measures.   Labs/tests ordered:  none  ManXie Mast NP Geriatrics [facility] [address] [place], [place] [address] On Call:  [phone] & follow prompts after 5pm & weekends Office Phone:  [phone] Office Fax:  [phone]

## 2015-05-22 NOTE — Assessment & Plan Note (Signed)
In setting of CHF, COPD, dementia, continue Hospice Service, goal of care is comfort measures.

## 2015-05-22 NOTE — Assessment & Plan Note (Signed)
05/20/15 urine culture E. Coli, 85,000c/ml 7 day course of Septra DS bid, tolerated.

## 2015-05-22 NOTE — Assessment & Plan Note (Signed)
Saw Pulmonology in the past, continue Spiriva and DuoNeb, O2 dependent.

## 2015-05-22 NOTE — Assessment & Plan Note (Signed)
Stabilized, continue Sertraline 25mg 

## 2015-05-22 NOTE — Assessment & Plan Note (Signed)
Progressing, off Aricept, no longer beneficial, goal of care is comfort measures, continue Hospice service.

## 2015-05-22 NOTE — Assessment & Plan Note (Signed)
05/21/15 Hgb 7.8, Iron 45, no active bleed, continue to monitor the patient.

## 2015-05-24 ENCOUNTER — Non-Acute Institutional Stay (SKILLED_NURSING_FACILITY): Payer: Medicare Other | Admitting: Internal Medicine

## 2015-05-24 ENCOUNTER — Encounter: Payer: Self-pay | Admitting: Internal Medicine

## 2015-05-24 DIAGNOSIS — R0902 Hypoxemia: Secondary | ICD-10-CM | POA: Diagnosis not present

## 2015-05-24 DIAGNOSIS — R627 Adult failure to thrive: Secondary | ICD-10-CM

## 2015-05-24 DIAGNOSIS — I5033 Acute on chronic diastolic (congestive) heart failure: Secondary | ICD-10-CM | POA: Diagnosis not present

## 2015-05-24 DIAGNOSIS — F0390 Unspecified dementia without behavioral disturbance: Secondary | ICD-10-CM

## 2015-05-24 NOTE — Progress Notes (Signed)
Patient ID: Sheri Hartman, female   DOB: 09-23-1918, 81 y.o.   MRN: [id number]    [facility] Room Number: 81  Place of Service: [facility] (31)     Allergies  Allergen Reactions  . Ciprofloxacin Other (See Comments)    dizziness and disorientation  . Tramadol Other (See Comments)    Disorientation, disconnected   . Latex Other (See Comments)    Unknown....years ago....eczema    Chief Complaint  Patient presents with  . Acute Visit    Follow-up congestive heart failure    HPI:  Although still quite confused and dazed and not responding verbally, she is improved since I last saw her. Her respiratory distress has come under control. She remains on oxygen. There is no peripheral edema. The increased Lasix that I prescribed seem to help the situation. Furosemide was discontinued 05/22/2015 due to her very closely intake. Other drugs discontinued at the same time included her pravastatin and multivitamins. She remains on a 7 day course of Septra DS that was started 05/20/2015. She has had a hospice consult.  Patient is clearly in a failure to thrive situation. Less oral intake improves substantially, I do not expect her to survive. Family is interested only in comfort care at this time.  Hemoglobin was surprisingly low on 05/21/2015 at 7.8 g percent.  Medications: Patient's Medications  New Prescriptions   No medications on file  Previous Medications   ACETAMINOPHEN (TYLENOL) 325 MG TABLET    Take 1 tablet (325 mg total) by mouth every 6 (six) hours as needed.   FERROUS SULFATE 325 (65 FE) MG TABLET    Take one tablet daily to correct anemia   FUROSEMIDE (LASIX) 40 MG TABLET    One daily to help control edema and CHF   IPRATROPIUM-ALBUTEROL (DUONEB) 0.5-2.5 (3) MG/3ML SOLN    Take 3 mLs by nebulization every 6 (six) hours as needed.   LACTOSE FREE NUTRITION (BOOST) LIQD    Take 237 mLs by mouth 2 (two) times daily between meals.   LORAZEPAM (ATIVAN) 0.5  MG TABLET    Take 0.5 mg by mouth daily. One tablet every 6 hours as needed for anxiety   LOSARTAN (COZAAR) 100 MG TABLET    Take 0.5 tablets (50 mg total) by mouth daily.   METOPROLOL TARTRATE (LOPRESSOR) 25 MG TABLET    One twice daily to control blood pressure and for CHF   MULTIPLE VITAMINS-MINERALS (MULTIVITAMIN ADULT PO)    Take 1 tablet by mouth daily.   NITROGLYCERIN (NITROSTAT) 0.4 MG SL TABLET    Place 0.4 mg under the tongue every 5 (five) minutes as needed for chest pain.   PRAVASTATIN (PRAVACHOL) 20 MG TABLET    Take 1 tablet (20 mg total) by mouth daily.   SERTRALINE (ZOLOFT) 25 MG TABLET    Take 25 mg by mouth daily.   SPIRONOLACTONE (ALDACTONE) 25 MG TABLET    One each morning to reduce edema and to strengthen the heart   TIOTROPIUM BROMIDE MONOHYDRATE (SPIRIVA RESPIMAT) 1.25 MCG/ACT AERS    Inhale 2 puffs into the lungs daily.  Modified Medications   No medications on file  Discontinued Medications   No medications on file     Review of Systems  Constitutional: Negative for fever, chills, diaphoresis, activity change, appetite change, fatigue and unexpected weight change.       FRAIL  HENT: Negative for congestion, ear discharge, ear pain, hearing loss, postnasal drip, rhinorrhea, sore throat,  tinnitus, trouble swallowing and voice change.   Eyes: Positive for visual disturbance (corrective lenses). Negative for pain, redness and itching.  Respiratory: Positive for shortness of breath. Negative for cough, choking, chest tightness and wheezing.        Oxygen dependent  Cardiovascular: Negative for chest pain, palpitations and leg swelling.  Gastrointestinal: Negative for nausea, abdominal pain, diarrhea, constipation and abdominal distention.  Endocrine: Negative for cold intolerance, heat intolerance, polydipsia, polyphagia and polyuria.  Genitourinary: Negative for dysuria, urgency, frequency, hematuria, flank pain, vaginal discharge, difficulty urinating and pelvic pain.    Musculoskeletal: Positive for gait problem. Negative for myalgias, back pain, arthralgias, neck pain and neck stiffness.  Skin: Negative for color change, pallor and rash.       Right sacral decubitus  Allergic/Immunologic: Negative.   Neurological: Negative for dizziness, tremors, seizures, syncope, weakness, numbness and headaches.       Memory disorder  Hematological: Negative for adenopathy. Does not bruise/bleed easily.       Anemic  Psychiatric/Behavioral: Positive for confusion. Negative for suicidal ideas, hallucinations, behavioral problems, sleep disturbance, dysphoric mood and agitation. The patient is nervous/anxious. The patient is not hyperactive.   All other systems reviewed and are negative.   Filed Vitals:   05/24/15 1358  BP: 120/70  Pulse: 80  Temp: 98.5 F (36.9 C)  Resp: 18  Height: _0  (1.575 m)  Weight: 101 lb (45.813 kg)   Body mass index is 18.47 kg/(m^2).  Physical Exam  Constitutional: She appears well-developed and well-nourished. No distress.  Frail  HENT:  Right Ear: External ear normal.  Left Ear: External ear normal.  Nose: Nose normal.  Mouth/Throat: Oropharynx is clear and moist. No oropharyngeal exudate.  Eyes: Conjunctivae and EOM are normal. Pupils are equal, round, and reactive to light. No scleral icterus.  Neck: No JVD present. No tracheal deviation present. No thyromegaly present.  Cardiovascular: Normal rate, regular rhythm, normal heart sounds and intact distal pulses.  Exam reveals no gallop and no friction rub.   No murmur heard. Pulmonary/Chest: No respiratory distress. She has no wheezes. She exhibits no tenderness.  Oxygen dependent. Tachypnea. Dry rales bilaterally.  Abdominal: She exhibits no distension and no mass. There is no tenderness.  Musculoskeletal: Normal range of motion. She exhibits edema. She exhibits no tenderness.  Unstable gait. Using walker. BLE trace edema  Neurological: She is alert. No cranial nerve  deficit. Coordination normal.  Unable to give her history.  Skin: No rash noted. She is not diaphoretic. No erythema. There is pallor.  Right sacral decubitus  Psychiatric: She has a normal mood and affect. Her behavior is normal. Thought content normal.  forgetful     Labs reviewed: Lab Summary Latest Ref Rng 05/16/2015 04/12/2015 04/12/2015 02/22/2015 02/21/2015  Hemoglobin 12.0 - 16.0 g/dL 8.2(A) (None) (None) 11.1(L) 10.7(L)  Hematocrit 36 - 46 % 26(A) (None) (None) 34.2(L) 31.8(L)  White count - 12.4 (None) (None) 10.3 11.0(H)  Platelet count 150 - 399 K/L 314 (None) (None) 335 323  Sodium 137 - 147 mmol/L 148(A) 1(A) 144 132(L) 130(L)  Potassium 3.4 - 5.3 mmol/L 4.5 (None) 4.4 4.3 4.6  Calcium 8.9 - 10.3 mg/dL (None) (None) (None) 8.5(L) 8.7(L)  Phosphorus - (None) (None) (None) (None) (None)  Creatinine .5 - 1.1 mg/dL 1.5(A) (None) 1.8(A) 1.51(H) 1.66(H)  AST 13 - 35 U/L 17 (None) (None) (None) (None)  Alk Phos 25 - 125 U/L 45 (None) (None) (None) (None)  Bilirubin - (None) (None) (None) (None) (None)  Glucose - 156 (None) 95 88 100(H)  Cholesterol - (None) (None) (None) (None) (None)  HDL cholesterol - (None) (None) (None) (None) (None)  Triglycerides - (None) (None) (None) (None) (None)  LDL Direct - (None) (None) (None) (None) (None)  LDL Calc - (None) (None) (None) (None) (None)  Total protein - (None) (None) (None) (None) (None)  Albumin - (None) (None) (None) (None) (None)   Lab Results  Component Value Date   TSH 1.30 08/28/2011   Lab Results  Component Value Date   BUN 55* 05/16/2015   05/21/2015 serum iron 45, percent saturation 28 Hemoglobin 7.8 Reticulocyte 1%  05/20/2015 urine culture 85,000 colonies Escherichia coli  Assessment/Plan  1. Acute on chronic diastolic CHF (congestive heart failure) (HCC) Improved  2. Dementia, without behavioral disturbance End-stage  3. FTT (failure to thrive) in adult Report oral intake  4. Hypoxia Continue on  oxygen supplementation

## 2015-06-11 DEATH — deceased

## 2016-02-25 IMAGING — CR DG LUMBAR SPINE COMPLETE 4+V
5 series · 5 of 5 positions shown · non-contrast
Comparison: None.

CLINICAL DATA: Fall.  Low back pain.

EXAM:
LUMBAR SPINE - COMPLETE 4+ VIEW

[t lumbar spine ap]
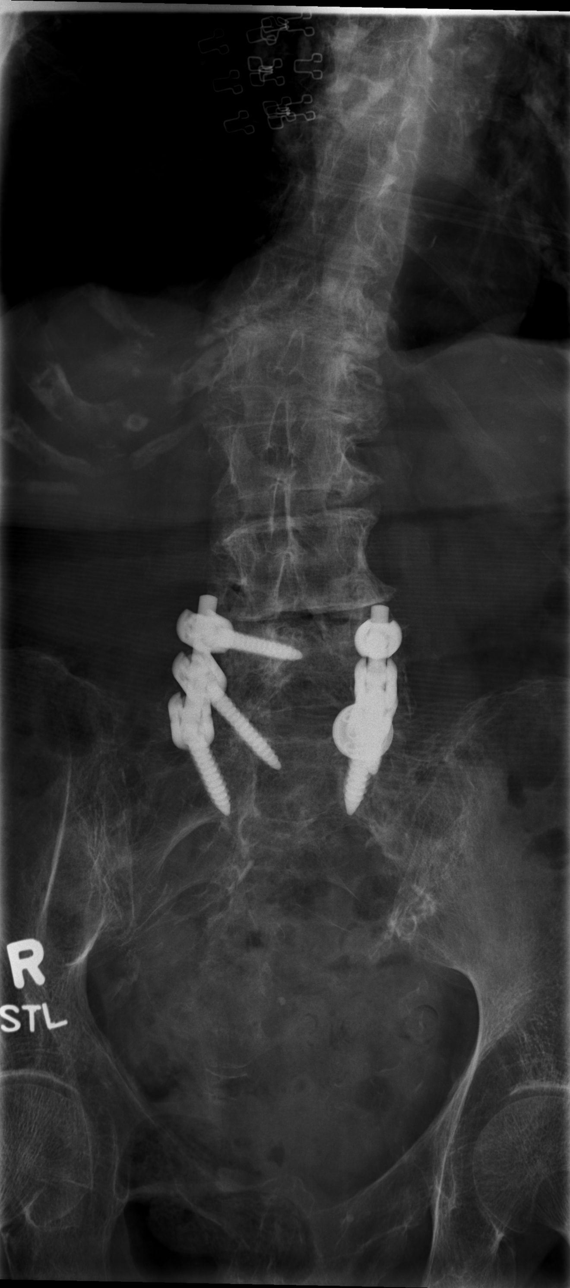

[t lumbar spine obl (1 of 2)]
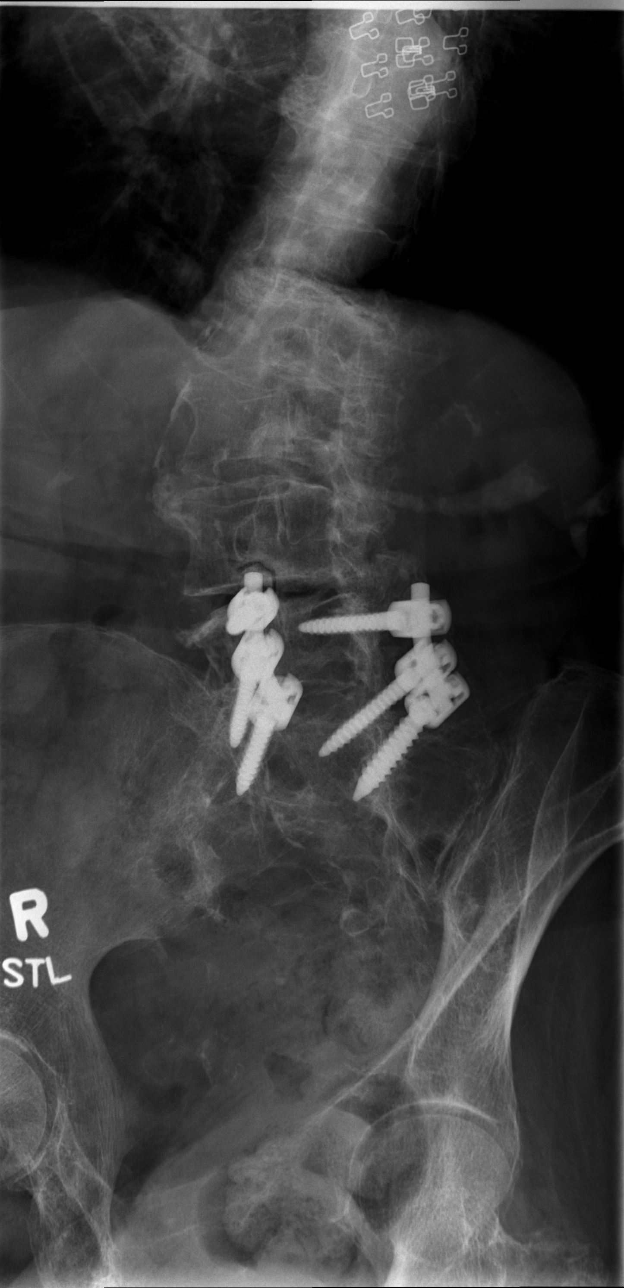

[t lumbar spine obl (2 of 2)]
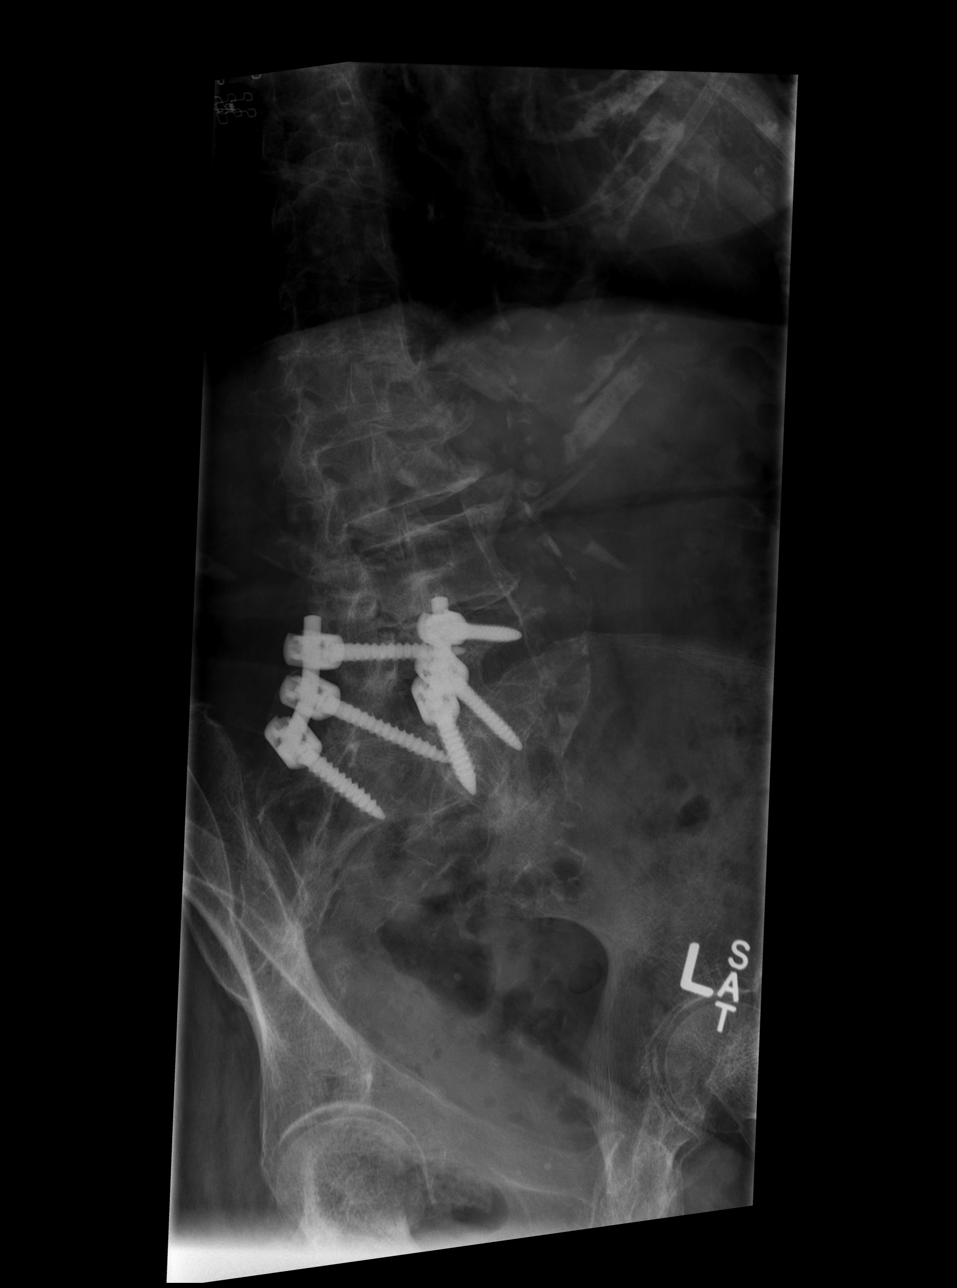

[t lumbar spine lat]
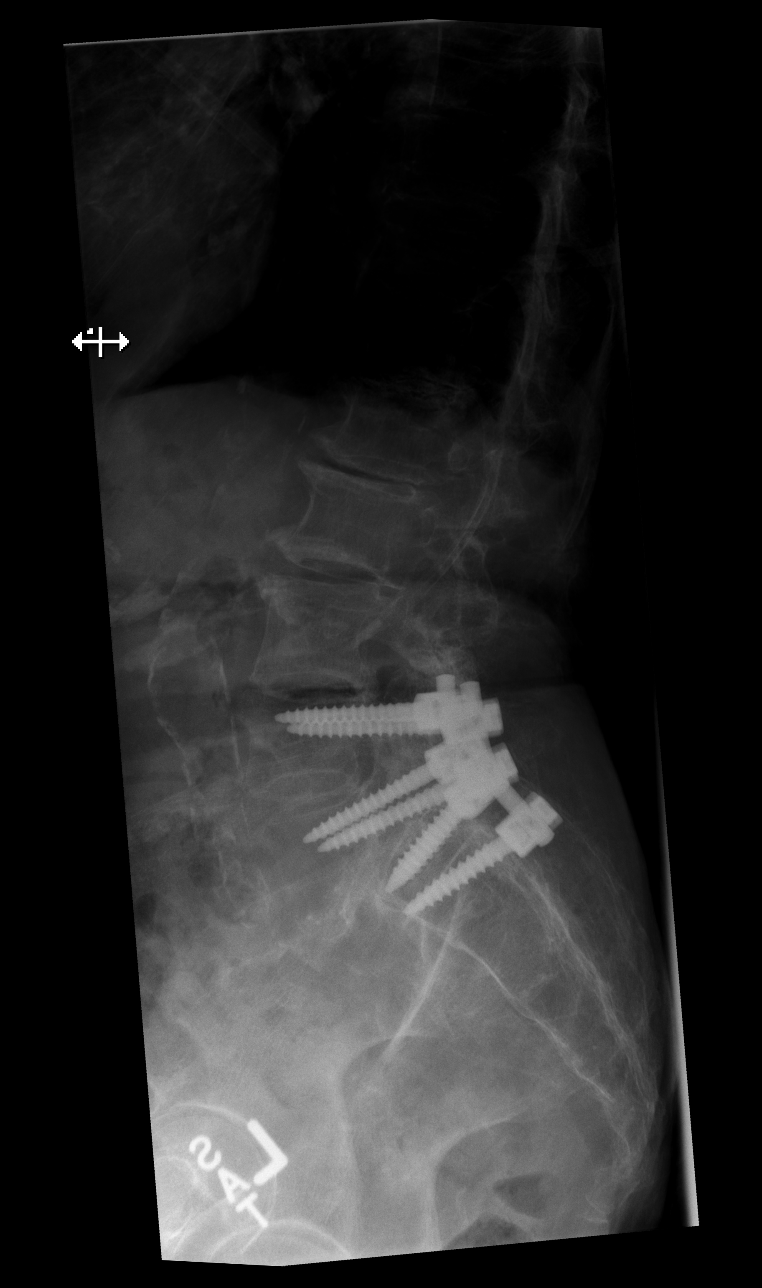

[t lumbar l-5 s-1 spot]
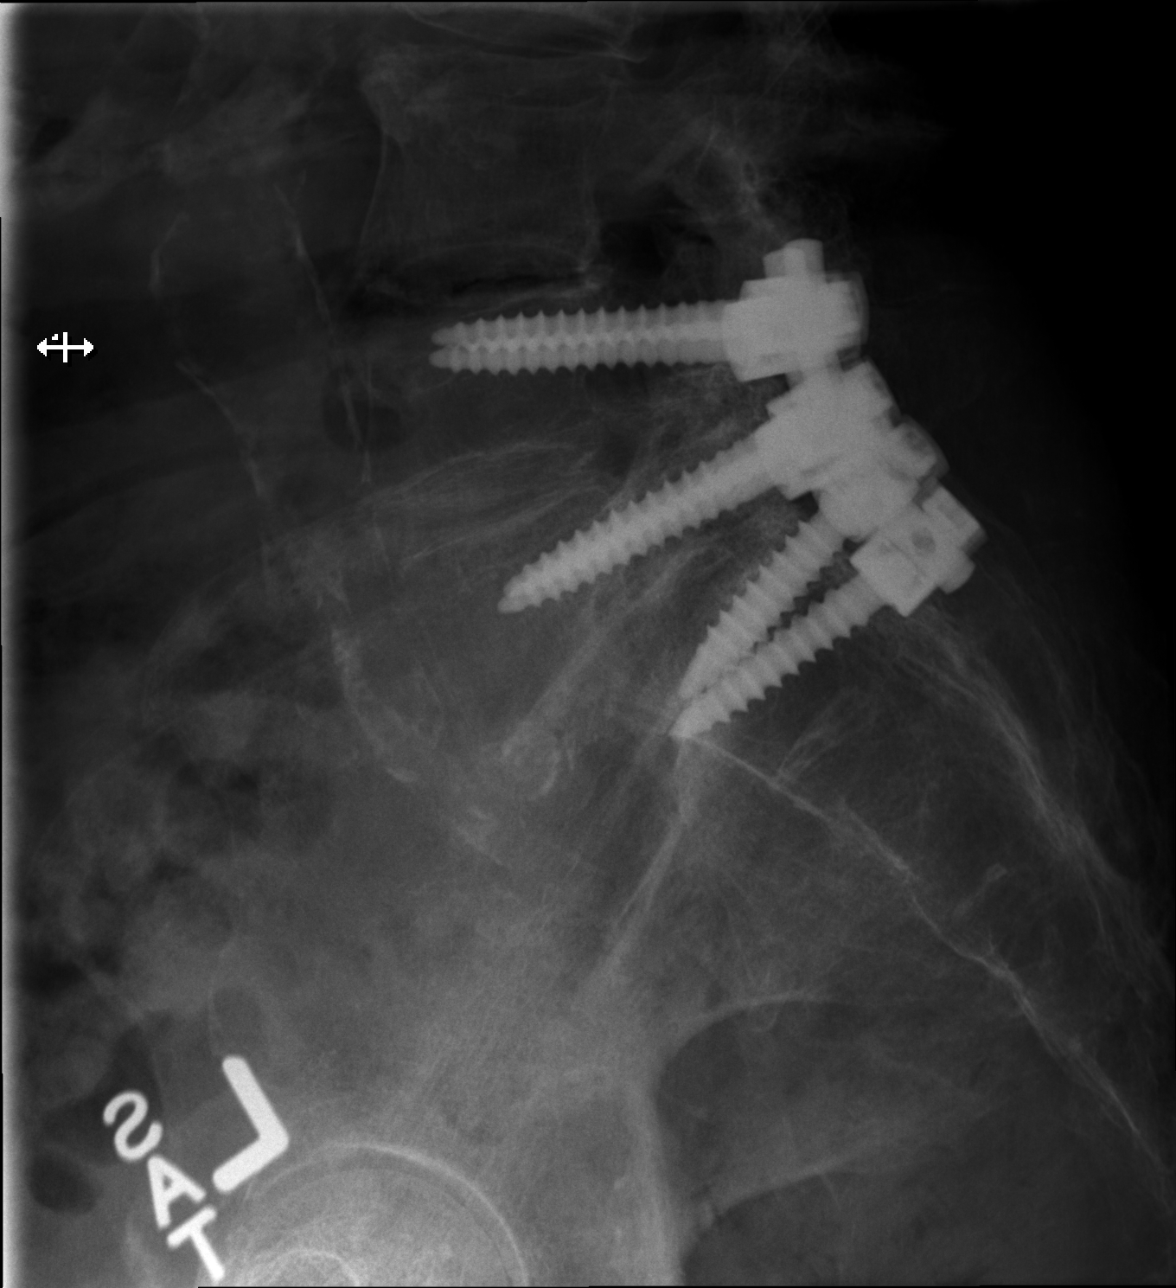

[5 of 5 positions shown; findings below may reference images not displayed]

FINDINGS: Patient is status post posterior fusion from L4-S1. Severe
anterolisthesis of L5 on S1, approximately 24 mm. No fracture. Mild
rightward scoliosis in the upper lumbar spine. Diffuse degenerative
disc and facet disease.
IMPRESSION: Posterior fusion from L4-S1 with severe (grade 3-4) anterolisthesis
of L5 on S1.

No fracture.
